# Patient Record
Sex: Male | Born: 1965 | Race: Asian | Hispanic: No | Marital: Married | Smoking: Never smoker
Health system: Southern US, Community
[De-identification: ages and names within clinical notes are randomized; demographics above are authoritative.]

## PROBLEM LIST (undated history)

## (undated) DIAGNOSIS — Z860100 Personal history of colon polyps, unspecified: Secondary | ICD-10-CM

## (undated) DIAGNOSIS — K219 Gastro-esophageal reflux disease without esophagitis: Secondary | ICD-10-CM

## (undated) DIAGNOSIS — E079 Disorder of thyroid, unspecified: Secondary | ICD-10-CM

## (undated) DIAGNOSIS — I10 Essential (primary) hypertension: Secondary | ICD-10-CM

## (undated) HISTORY — DX: Personal history of colonic polyps: Z86.010

## (undated) HISTORY — DX: Personal history of colon polyps, unspecified: Z86.0100

## (undated) HISTORY — PX: UPPER GASTROINTESTINAL ENDOSCOPY: SHX188

## (undated) HISTORY — PX: APPENDECTOMY: SHX54

---

## 2010-03-05 ENCOUNTER — Emergency Department (HOSPITAL_BASED_OUTPATIENT_CLINIC_OR_DEPARTMENT_OTHER): Admission: EM | Admit: 2010-03-05 | Discharge: 2010-03-05 | Payer: Self-pay | Admitting: Emergency Medicine

## 2010-03-05 ENCOUNTER — Ambulatory Visit: Payer: Self-pay | Admitting: Diagnostic Radiology

## 2010-11-19 LAB — BASIC METABOLIC PANEL WITH GFR
BUN: 24 mg/dL — ABNORMAL HIGH (ref 6–23)
Potassium: 4.2 meq/L (ref 3.5–5.1)
Sodium: 142 meq/L (ref 135–145)

## 2010-11-19 LAB — POCT CARDIAC MARKERS
CKMB, poc: <1 ng/mL — ABNORMAL LOW (ref 1.0–8.0)
Myoglobin, poc: 48.2 ng/mL (ref 12–200)
Troponin i, poc: <0.05 ng/mL (ref 0.00–0.09)

## 2010-11-19 LAB — CBC
HCT: 46.2 % (ref 39.0–52.0)
Hemoglobin: 15.9 g/dL (ref 13.0–17.0)
MCH: 30.4 pg (ref 26.0–34.0)
MCHC: 34.3 g/dL (ref 30.0–36.0)
MCV: 88.5 fL (ref 78.0–100.0)
Platelets: 226 10*3/uL (ref 150–400)
RBC: 5.22 MIL/uL (ref 4.22–5.81)
RDW: 12.1 % (ref 11.5–15.5)
WBC: 6.2 K/uL (ref 4.0–10.5)

## 2010-11-19 LAB — DIFFERENTIAL
Basophils Absolute: 0.1 10*3/uL (ref 0.0–0.1)
Basophils Relative: 2 % — ABNORMAL HIGH (ref 0–1)
Eosinophils Absolute: 0.2 10*3/uL (ref 0.0–0.7)
Eosinophils Relative: 3 % (ref 0–5)
Lymphocytes Relative: 27 % (ref 12–46)
Lymphs Abs: 1.7 10*3/uL (ref 0.7–4.0)
Monocytes Absolute: 0.5 K/uL (ref 0.1–1.0)
Monocytes Relative: 8 % (ref 3–12)
Neutro Abs: 3.7 K/uL (ref 1.7–7.7)
Neutrophils Relative %: 60 % (ref 43–77)

## 2010-11-19 LAB — BASIC METABOLIC PANEL
CO2: 27 mEq/L (ref 19–32)
Calcium: 9 mg/dL (ref 8.4–10.5)
Chloride: 104 mEq/L (ref 96–112)
Creatinine, Ser: 1.1 mg/dL (ref 0.4–1.5)
GFR calc Af Amer: >60 mL/min (ref >60)
GFR calc non Af Amer: >60 mL/min (ref >60)
Glucose, Bld: 105 mg/dL — ABNORMAL HIGH (ref 70–99)

## 2010-11-19 LAB — APTT: aPTT: 31 s (ref 24–37)

## 2010-11-19 LAB — PROTIME-INR
INR: 1.06 (ref 0.00–1.49)
Prothrombin Time: 13.7 s (ref 11.6–15.2)

## 2010-11-19 LAB — D-DIMER, QUANTITATIVE: D-Dimer, Quant: <0.22 ug{FEU}/mL (ref 0.00–0.48)

## 2017-06-09 ENCOUNTER — Encounter (HOSPITAL_COMMUNITY): Payer: Self-pay | Admitting: *Deleted

## 2017-06-09 ENCOUNTER — Emergency Department (HOSPITAL_BASED_OUTPATIENT_CLINIC_OR_DEPARTMENT_OTHER)
Admission: EM | Admit: 2017-06-09 | Discharge: 2017-06-09 | Disposition: A | Discharge status: Home / Self Care | Payer: BLUE CROSS/BLUE SHIELD | Source: Home / Self Care | Attending: Emergency Medicine | Admitting: Emergency Medicine

## 2017-06-09 ENCOUNTER — Encounter (HOSPITAL_BASED_OUTPATIENT_CLINIC_OR_DEPARTMENT_OTHER): Payer: Self-pay

## 2017-06-09 ENCOUNTER — Observation Stay (HOSPITAL_COMMUNITY)
Admission: EM | Admit: 2017-06-09 | Discharge: 2017-06-13 | DRG: 916 | Disposition: A | Discharge status: Home / Self Care | Payer: BLUE CROSS/BLUE SHIELD | Attending: Internal Medicine | Admitting: Internal Medicine

## 2017-06-09 DIAGNOSIS — T7840XA Allergy, unspecified, initial encounter: Secondary | ICD-10-CM

## 2017-06-09 DIAGNOSIS — T380X5A Adverse effect of glucocorticoids and synthetic analogues, initial encounter: Secondary | ICD-10-CM | POA: Diagnosis not present

## 2017-06-09 DIAGNOSIS — E079 Disorder of thyroid, unspecified: Secondary | ICD-10-CM

## 2017-06-09 DIAGNOSIS — L509 Urticaria, unspecified: Secondary | ICD-10-CM | POA: Insufficient documentation

## 2017-06-09 DIAGNOSIS — R739 Hyperglycemia, unspecified: Secondary | ICD-10-CM | POA: Diagnosis not present

## 2017-06-09 DIAGNOSIS — Z79899 Other long term (current) drug therapy: Secondary | ICD-10-CM | POA: Diagnosis not present

## 2017-06-09 DIAGNOSIS — D72829 Elevated white blood cell count, unspecified: Secondary | ICD-10-CM | POA: Diagnosis present

## 2017-06-09 DIAGNOSIS — Z2809 Immunization not carried out because of other contraindication: Secondary | ICD-10-CM

## 2017-06-09 DIAGNOSIS — T783XXA Angioneurotic edema, initial encounter: Principal | ICD-10-CM | POA: Diagnosis present

## 2017-06-09 DIAGNOSIS — E039 Hypothyroidism, unspecified: Secondary | ICD-10-CM | POA: Diagnosis not present

## 2017-06-09 DIAGNOSIS — K219 Gastro-esophageal reflux disease without esophagitis: Secondary | ICD-10-CM | POA: Diagnosis present

## 2017-06-09 DIAGNOSIS — I10 Essential (primary) hypertension: Secondary | ICD-10-CM | POA: Diagnosis present

## 2017-06-09 DIAGNOSIS — R091 Pleurisy: Secondary | ICD-10-CM

## 2017-06-09 DIAGNOSIS — X58XXXA Exposure to other specified factors, initial encounter: Secondary | ICD-10-CM | POA: Diagnosis present

## 2017-06-09 HISTORY — DX: Essential (primary) hypertension: I10

## 2017-06-09 HISTORY — DX: Gastro-esophageal reflux disease without esophagitis: K21.9

## 2017-06-09 HISTORY — DX: Disorder of thyroid, unspecified: E07.9

## 2017-06-09 LAB — BASIC METABOLIC PANEL
Anion gap: 9 (ref 5–15)
BUN: 19 mg/dL (ref 6–20)
CALCIUM: 8.4 mg/dL — AB (ref 8.9–10.3)
CHLORIDE: 106 mmol/L (ref 101–111)
CO2: 22 mmol/L (ref 22–32)
CREATININE: 1.16 mg/dL (ref 0.61–1.24)
GFR calc Af Amer: >60 mL/min (ref >60)
GFR calc non Af Amer: >60 mL/min (ref >60)
GLUCOSE: 150 mg/dL — AB (ref 65–99)
Potassium: 3.9 mmol/L (ref 3.5–5.1)
Sodium: 137 mmol/L (ref 135–145)

## 2017-06-09 LAB — CBC
HEMATOCRIT: 42.5 % (ref 39.0–52.0)
HEMOGLOBIN: 14.8 g/dL (ref 13.0–17.0)
MCH: 30 pg (ref 26.0–34.0)
MCHC: 34.8 g/dL (ref 30.0–36.0)
MCV: 86 fL (ref 78.0–100.0)
Platelets: 236 10*3/uL (ref 150–400)
RBC: 4.94 MIL/uL (ref 4.22–5.81)
RDW: 12.8 % (ref 11.5–15.5)
WBC: 17.8 10*3/uL — ABNORMAL HIGH (ref 4.0–10.5)

## 2017-06-09 MED ORDER — FAMOTIDINE 20 MG PO TABS
20.0000 mg | ORAL_TABLET | Freq: Once | ORAL | Status: AC
  Administered 2017-06-09: 20 mg via ORAL
  Filled 2017-06-09: qty 1

## 2017-06-09 MED ORDER — SODIUM CHLORIDE 0.9 % IV BOLUS (SEPSIS)
1000.0000 mL | Freq: Once | INTRAVENOUS | Status: AC
  Administered 2017-06-09: 1000 mL via INTRAVENOUS

## 2017-06-09 MED ORDER — FAMOTIDINE 20 MG PO TABS: 20.0000 mg | ORAL_TABLET | Freq: Two times a day (BID) | ORAL | 0 refills | Status: DC

## 2017-06-09 MED ORDER — DIPHENHYDRAMINE HCL 50 MG/ML IJ SOLN
50.0000 mg | Freq: Once | INTRAMUSCULAR | Status: AC
  Administered 2017-06-09: 50 mg via INTRAMUSCULAR
  Filled 2017-06-09: qty 1

## 2017-06-09 MED ORDER — DIPHENHYDRAMINE HCL 50 MG/ML IJ SOLN
25.0000 mg | Freq: Once | INTRAMUSCULAR | Status: AC
  Administered 2017-06-09: 25 mg via INTRAVENOUS
  Filled 2017-06-09: qty 1

## 2017-06-09 MED ORDER — EPINEPHRINE 0.3 MG/0.3ML IJ SOAJ
0.3000 mg | Freq: Once | INTRAMUSCULAR | Status: AC
  Administered 2017-06-09: 0.3 mg via INTRAMUSCULAR
  Filled 2017-06-09: qty 0.3

## 2017-06-09 MED ORDER — DEXAMETHASONE SODIUM PHOSPHATE 10 MG/ML IJ SOLN
10.0000 mg | Freq: Once | INTRAMUSCULAR | Status: AC
  Administered 2017-06-09: 10 mg via INTRAVENOUS
  Filled 2017-06-09: qty 1

## 2017-06-09 MED ORDER — DIPHENHYDRAMINE HCL 25 MG PO TABS: 50.0000 mg | ORAL_TABLET | Freq: Four times a day (QID) | ORAL | 0 refills | Status: DC | PRN

## 2017-06-09 MED ORDER — LORAZEPAM 1 MG PO TABS: 1.0000 mg | ORAL_TABLET | Freq: Three times a day (TID) | ORAL | 0 refills | Status: DC | PRN

## 2017-06-09 MED ORDER — FAMOTIDINE IN NACL 20-0.9 MG/50ML-% IV SOLN
20.0000 mg | Freq: Once | INTRAVENOUS | Status: AC
  Administered 2017-06-09: 20 mg via INTRAVENOUS
  Filled 2017-06-09: qty 50

## 2017-06-09 MED ORDER — SODIUM CHLORIDE 0.9 % IV SOLN
INTRAVENOUS | Status: DC
  Administered 2017-06-09 – 2017-06-10 (×2): via INTRAVENOUS

## 2017-06-09 MED ORDER — DIPHENHYDRAMINE HCL 25 MG PO TABS: 50.0000 mg | ORAL_TABLET | ORAL | 0 refills | Status: DC | PRN

## 2017-06-09 NOTE — ED Provider Notes (Signed)
[facility]-EMERGENCY DEPT Provider Note   CSN: [id number] Arrival date & time: 06/09/17  2021     History   Chief Complaint Chief Complaint  Patient presents with  . Allergic Reaction    HPI Christian Hays is a 51 y.o. male.  HPI 51 year old man presents today complaining of urticaria. He states he began having some allergic symptoms yesterday after eating at [employer]. This consisted mainly of a rash. He was seen in his primary care office and started on Benadryl. The rash worsened during the night and he was seen at [facility] [place] this morning. That time he received drill, Pepcid, and is started on prednisone. This afternoon the symptoms became worse and he has had some coughing, dyspnea, sensation of his tongue swelling. The rash has also worsened. He has had no recent changes in medications and has had no previous known allergens. Past Medical History:  Diagnosis Date  . GERD (gastroesophageal reflux disease)   . Hypertension   . Thyroid disease     There are no active problems to display for this patient.   Past Surgical History:  Procedure Laterality Date  . APPENDECTOMY         Home Medications    Prior to Admission medications   Medication Sig Start Date End Date Taking? Authorizing Provider  diphenhydrAMINE (BENADRYL) 25 MG tablet Take 2 tablets (50 mg total) by mouth every 6 (six) hours as needed for itching. 06/09/17   Arby Barrette, MD  famotidine (PEPCID) 20 MG tablet Take 1 tablet (20 mg total) by mouth 2 (two) times daily. 06/09/17   Arby Barrette, MD  LORazepam (ATIVAN) 1 MG tablet Take 1 tablet (1 mg total) by mouth 3 (three) times daily as needed for sleep. 06/09/17   Arby Barrette, MD  OMEPRAZOLE PO Take by mouth.    [provider]  PREDNISONE PO Take by mouth.    [provider]  UNABLE TO FIND Med Name: HTN, Thyroid meds    [provider]    Family History No family history on file.  Social History Social  History  Substance Use Topics  . Smoking status: Never Smoker  . Smokeless tobacco: Never Used  . Alcohol use No     Allergies   Patient has no known allergies.   Review of Systems Review of Systems  All other systems reviewed and are negative.    Physical Exam Updated Vital Signs BP (!) 126/95 (BP Location: Left Arm)   Pulse 79   Temp (!) 97.4 F (36.3 C) (Oral)   Resp 18   Wt 70.8 kg (156 lb)   SpO2 97%   BMI 21.16 kg/m   Physical Exam  Constitutional: He is oriented to person, place, and time. He appears well-developed and well-nourished.  HENT:  Head: Normocephalic.  Right Ear: External ear normal.  Left Ear: External ear normal.  Eyes: Pupils are equal, round, and reactive to light. Conjunctivae and EOM are normal.  Neck: Normal range of motion. Neck supple.  Cardiovascular: Normal rate, regular rhythm and normal heart sounds.   Pulmonary/Chest: Effort normal and breath sounds normal.  Abdominal: Soft. Bowel sounds are normal.  Musculoskeletal: Normal range of motion.  Neurological: He is alert and oriented to person, place, and time.  Skin: Capillary refill takes less than 2 seconds. Rash noted.  Urticarial rash involving right eye, head, neck, trunk, and all 4 extremities.  Psychiatric: He has a normal mood and affect. His behavior is normal.  Nursing  note and vitals reviewed.    ED Treatments / Results  Labs (all labs ordered are listed, but only abnormal results are displayed) Labs Reviewed  CBC  BASIC METABOLIC PANEL    EKG  EKG Interpretation None       Radiology No results found.  Procedures Procedures (including critical care time)  Medications Ordered in ED Medications  sodium chloride 0.9 % bolus 1,000 mL (0 mLs Intravenous Stopped 06/09/17 2152)    And  0.9 %  sodium chloride infusion ( Intravenous New Bag/Given 06/09/17 2152)  diphenhydrAMINE (BENADRYL) injection 25 mg (25 mg Intravenous Given 06/09/17 2048)  EPINEPHrine  (EPI-PEN) injection 0.3 mg (0.3 mg Intramuscular Given 06/09/17 2054)  dexamethasone (DECADRON) injection 10 mg (10 mg Intravenous Given 06/09/17 2047)  famotidine (PEPCID) IVPB 20 mg premix (0 mg Intravenous Stopped 06/09/17 2152)     Initial Impression / Assessment and Plan / ED Course  I have reviewed the triage vital signs and the nursing notes.  Pertinent labs & imaging results that were available during my care of the patient were reviewed by me and considered in my medical decision making (see chart for details).     11:14 PM Vitals:   06/09/17 2028  BP: (!) 126/95  Pulse: 79  Resp: 18  Temp: (!) 97.4 F (36.3 C)  SpO2: 97%   Patient Hemodynamically stable but continues to have diffuse hives. Plan observation. Discussed with Dr. Melynda Ripple and he will see and assess Final Clinical Impressions(s) / ED Diagnoses   Final diagnoses:  Allergic reaction, initial encounter    New Prescriptions New Prescriptions   No medications on file     Margarita Grizzle, MD 06/09/17 2353

## 2017-06-09 NOTE — ED Notes (Signed)
ED Provider at bedside. 

## 2017-06-09 NOTE — Discharge Instructions (Signed)
1. Take 60 mg of prednisone today and tomorrow, for the next 3 days take 40 mg, for the next 3 days take 20 mg, for the last 3 days take 10 mg. 2. Take Benadryl 50 mg every 4-6 hours for itching as needed. 3. Take Pepcid 20 mg twice daily. 4. Try to eliminate all foods and cosmetic products that have multiple ingredients. Try to eat only very simple, home prepared foods that you are sure you are not allergic to. Get nonallergenic soaps and cosmetic products free of fragrance and dye.

## 2017-06-09 NOTE — ED Triage Notes (Signed)
Allergic reaction since Saturday am while eating at [employer].  huives itching  He was treated at an ed and has been back to [place] [facility] x 2 for the same.  Today he has hives some difficulty breathing  And he feels like his tongue was swollen.  Hives over his entire body  Alert  No distress

## 2017-06-09 NOTE — ED Triage Notes (Addendum)
Pt reports generalized body rash that itches - onset yesterday - seen by PCP for possible food allergy (new onset), given steroid shot and placed on Prednisone. Denies fever, denies history of same rash, denies staying at hotels, or contacts with similar rash. Denies known allergies. States last international travel was 2 months ago. Denies insect bite.

## 2017-06-09 NOTE — ED Provider Notes (Signed)
[facility] Provider Note   CSN: [id number] Arrival date & time: 06/09/17  4098     History   Chief Complaint Chief Complaint  Patient presents with  . Rash    HPI Christian Hays is a 51 y.o. male.  HPI Patient developed a very itchy swollen rash day before yesterday. He had gotten coffee and a egg biscuit at [employer]. He reports he always has a cough and has no issues. He reports about an hour or 2 after that biscuit he started developing these patches of swollen itchy rash. No associated difficulty breathing or other associated symptoms. Patient saw his PCP and was given prednisone and Benadryl. Patient reports she's had terrible itching overnight and his hands are more swollen. Patient got a shot of steroid in the office and has taken 40 mg of prednisone. He reports he is taking 25 mg and last dose was yesterday evening. Past Medical History:  Diagnosis Date  . GERD (gastroesophageal reflux disease)   . Hypertension   . Thyroid disease     There are no active problems to display for this patient.   Past Surgical History:  Procedure Laterality Date  . APPENDECTOMY         Home Medications    Prior to Admission medications   Medication Sig Start Date End Date Taking? Authorizing Provider  OMEPRAZOLE PO Take by mouth.   Yes [provider]  PREDNISONE PO Take by mouth.   Yes [provider]  UNABLE TO FIND Med Name: HTN, Thyroid meds   Yes [provider]  diphenhydrAMINE (BENADRYL) 25 MG tablet Take 2 tablets (50 mg total) by mouth every 6 (six) hours as needed for itching. 06/09/17   Arby Barrette, MD  famotidine (PEPCID) 20 MG tablet Take 1 tablet (20 mg total) by mouth 2 (two) times daily. 06/09/17   Arby Barrette, MD  LORazepam (ATIVAN) 1 MG tablet Take 1 tablet (1 mg total) by mouth 3 (three) times daily as needed for sleep. 06/09/17   Arby Barrette, MD    Family History History reviewed. No pertinent family  history.  Social History Social History  Substance Use Topics  . Smoking status: Never Smoker  . Smokeless tobacco: Never Used  . Alcohol use No     Allergies   Patient has no known allergies.   Review of Systems Review of Systems 10 Systems reviewed and are negative for acute change except as noted in the HPI.   Physical Exam Updated Vital Signs BP 113/79 (BP Location: Right Arm)   Pulse 68   Temp 97.6 F (36.4 C) (Oral)   Resp 16   Ht 6' (1.829 m)   Wt 68.9 kg (152 lb)   SpO2 98%   BMI 20.61 kg/m   Physical Exam  Constitutional: He is oriented to person, place, and time. He appears well-developed and well-nourished.  HENT:  Head: Normocephalic and atraumatic.  Nose: Nose normal.  Mouth/Throat: Oropharynx is clear and moist.  Eyes: Conjunctivae and EOM are normal.  Neck: Neck supple.  Cardiovascular: Normal rate and regular rhythm.   No murmur heard. Pulmonary/Chest: Effort normal and breath sounds normal. No respiratory distress.  Abdominal: Soft. There is no tenderness.  Musculoskeletal: He exhibits no edema.  Neurological: He is alert and oriented to person, place, and time. No cranial nerve deficit. He exhibits normal muscle tone. Coordination normal.  Skin: Skin is warm and dry. Rash noted.  Psychiatric: He has a normal mood and affect.  Nursing note and vitals reviewed.          ED Treatments / Results  Labs (all labs ordered are listed, but only abnormal results are displayed) Labs Reviewed - No data to display  EKG  EKG Interpretation None       Radiology No results found.  Procedures Procedures (including critical care time)  Medications Ordered in ED Medications  diphenhydrAMINE (BENADRYL) injection 50 mg (50 mg Intramuscular Given 06/09/17 0752)  famotidine (PEPCID) tablet 20 mg (20 mg Oral Given 06/09/17 5409)     Initial Impression / Assessment and Plan / ED Course  I have reviewed the triage vital signs and the nursing  notes.  Pertinent labs & imaging results that were available during my care of the patient were reviewed by me and considered in my medical decision making (see chart for details).      Final Clinical Impressions(s) / ED Diagnoses   Final diagnoses:  Urticaria   Patient is very symptomatic and uncomfortable with urticarial rash. This is likely allergic however unknown trigger. Possibly something contained within an egg biscuit from [employer]. His physician is artery planning for allergy testing. At this time we'll have the patient increase his prednisone to 60 mg for 2 days and then begin his taper. Will add Pepcid twice daily. Patient has been taking Benadryl 25 mg will have him take 50 every 4-6 hours. Patient is extremely uncomfortable from itching, will add Ativan when necessary if other medications are still not allowing him to sleep. He is otherwise nontoxic and well and appearance. New Prescriptions New Prescriptions   DIPHENHYDRAMINE (BENADRYL) 25 MG TABLET    Take 2 tablets (50 mg total) by mouth every 6 (six) hours as needed for itching.   FAMOTIDINE (PEPCID) 20 MG TABLET    Take 1 tablet (20 mg total) by mouth 2 (two) times daily.   LORAZEPAM (ATIVAN) 1 MG TABLET    Take 1 tablet (1 mg total) by mouth 3 (three) times daily as needed for sleep.     Arby Barrette, MD 06/09/17 0800

## 2017-06-10 ENCOUNTER — Observation Stay (HOSPITAL_COMMUNITY): Payer: BLUE CROSS/BLUE SHIELD

## 2017-06-10 DIAGNOSIS — L509 Urticaria, unspecified: Secondary | ICD-10-CM | POA: Diagnosis not present

## 2017-06-10 DIAGNOSIS — D72829 Elevated white blood cell count, unspecified: Secondary | ICD-10-CM | POA: Diagnosis not present

## 2017-06-10 DIAGNOSIS — T783XXA Angioneurotic edema, initial encounter: Secondary | ICD-10-CM | POA: Diagnosis not present

## 2017-06-10 DIAGNOSIS — I10 Essential (primary) hypertension: Secondary | ICD-10-CM | POA: Diagnosis not present

## 2017-06-10 DIAGNOSIS — K219 Gastro-esophageal reflux disease without esophagitis: Secondary | ICD-10-CM

## 2017-06-10 DIAGNOSIS — Z2809 Immunization not carried out because of other contraindication: Secondary | ICD-10-CM | POA: Diagnosis not present

## 2017-06-10 LAB — CBC WITH DIFFERENTIAL/PLATELET
BASOS PCT: 0 %
Basophils Absolute: 0 10*3/uL (ref 0.0–0.1)
EOS ABS: 0 10*3/uL (ref 0.0–0.7)
Eosinophils Relative: 0 %
HEMATOCRIT: 41.8 % (ref 39.0–52.0)
Hemoglobin: 14.8 g/dL (ref 13.0–17.0)
Lymphocytes Relative: 4 %
Lymphs Abs: 0.5 10*3/uL — ABNORMAL LOW (ref 0.7–4.0)
MCH: 30.7 pg (ref 26.0–34.0)
MCHC: 35.4 g/dL (ref 30.0–36.0)
MCV: 86.7 fL (ref 78.0–100.0)
MONO ABS: 0.6 10*3/uL (ref 0.1–1.0)
MONOS PCT: 4 %
NEUTROS ABS: 13 10*3/uL — AB (ref 1.7–7.7)
Neutrophils Relative %: 92 %
Platelets: 232 10*3/uL (ref 150–400)
RBC: 4.82 MIL/uL (ref 4.22–5.81)
RDW: 12.6 % (ref 11.5–15.5)
WBC: 14.1 10*3/uL — ABNORMAL HIGH (ref 4.0–10.5)

## 2017-06-10 LAB — URINALYSIS, ROUTINE W REFLEX MICROSCOPIC
BILIRUBIN URINE: NEGATIVE
Glucose, UA: NEGATIVE mg/dL
Hgb urine dipstick: NEGATIVE
KETONES UR: NEGATIVE mg/dL
Leukocytes, UA: NEGATIVE
NITRITE: NEGATIVE
PROTEIN: NEGATIVE mg/dL
Specific Gravity, Urine: 1.005 (ref 1.005–1.030)
pH: 6 (ref 5.0–8.0)

## 2017-06-10 LAB — BASIC METABOLIC PANEL
Anion gap: 7 (ref 5–15)
BUN: 19 mg/dL (ref 6–20)
CALCIUM: 8.5 mg/dL — AB (ref 8.9–10.3)
CHLORIDE: 106 mmol/L (ref 101–111)
CO2: 24 mmol/L (ref 22–32)
CREATININE: 0.89 mg/dL (ref 0.61–1.24)
Glucose, Bld: 174 mg/dL — ABNORMAL HIGH (ref 65–99)
Potassium: 4.8 mmol/L (ref 3.5–5.1)
SODIUM: 137 mmol/L (ref 135–145)

## 2017-06-10 LAB — D-DIMER, QUANTITATIVE (NOT AT ARMC): D DIMER QUANT: 3.48 ug{FEU}/mL — AB (ref 0.00–0.50)

## 2017-06-10 LAB — HEPATIC FUNCTION PANEL
ALBUMIN: 3.8 g/dL (ref 3.5–5.0)
ALT: 55 U/L (ref 17–63)
AST: 62 U/L — ABNORMAL HIGH (ref 15–41)
Alkaline Phosphatase: 66 U/L (ref 38–126)
BILIRUBIN DIRECT: 0.2 mg/dL (ref 0.1–0.5)
BILIRUBIN INDIRECT: 0.5 mg/dL (ref 0.3–0.9)
BILIRUBIN TOTAL: 0.7 mg/dL (ref 0.3–1.2)
Total Protein: 6.3 g/dL — ABNORMAL LOW (ref 6.5–8.1)

## 2017-06-10 LAB — HIV ANTIBODY (ROUTINE TESTING W REFLEX): HIV Screen 4th Generation wRfx: NONREACTIVE

## 2017-06-10 LAB — CBC
HEMATOCRIT: 43.1 % (ref 39.0–52.0)
HEMOGLOBIN: 15.1 g/dL (ref 13.0–17.0)
MCH: 30.1 pg (ref 26.0–34.0)
MCHC: 35 g/dL (ref 30.0–36.0)
MCV: 86 fL (ref 78.0–100.0)
Platelets: 241 10*3/uL (ref 150–400)
RBC: 5.01 MIL/uL (ref 4.22–5.81)
RDW: 13 % (ref 11.5–15.5)
WBC: 16.3 10*3/uL — ABNORMAL HIGH (ref 4.0–10.5)

## 2017-06-10 LAB — T4, FREE: Free T4: 0.71 ng/dL (ref 0.61–1.12)

## 2017-06-10 LAB — CREATININE, SERUM
Creatinine, Ser: 1.1 mg/dL (ref 0.61–1.24)
GFR calc Af Amer: >60 mL/min (ref >60)
GFR calc non Af Amer: >60 mL/min (ref >60)

## 2017-06-10 LAB — GLUCOSE, CAPILLARY
GLUCOSE-CAPILLARY: 168 mg/dL — AB (ref 65–99)
Glucose-Capillary: 128 mg/dL — ABNORMAL HIGH (ref 65–99)

## 2017-06-10 LAB — TSH: TSH: 0.829 u[IU]/mL (ref 0.350–4.500)

## 2017-06-10 LAB — SEDIMENTATION RATE: SED RATE: 2 mm/h (ref 0–16)

## 2017-06-10 MED ORDER — ONDANSETRON HCL 4 MG/2ML IJ SOLN: 4.0000 mg | Freq: Four times a day (QID) | INTRAMUSCULAR | Status: DC | PRN

## 2017-06-10 MED ORDER — DEXAMETHASONE SODIUM PHOSPHATE 10 MG/ML IJ SOLN
10.0000 mg | Freq: Once | INTRAMUSCULAR | Status: AC
  Administered 2017-06-10: 10 mg via INTRAVENOUS
  Filled 2017-06-10: qty 1

## 2017-06-10 MED ORDER — LORATADINE 10 MG PO TABS
10.0000 mg | ORAL_TABLET | Freq: Every day | ORAL | Status: DC
  Administered 2017-06-10 – 2017-06-13 (×4): 10 mg via ORAL
  Filled 2017-06-10 (×4): qty 1

## 2017-06-10 MED ORDER — HEPARIN SODIUM (PORCINE) 5000 UNIT/ML IJ SOLN
5000.0000 [IU] | Freq: Three times a day (TID) | INTRAMUSCULAR | Status: DC
  Administered 2017-06-10 (×3): 5000 [IU] via SUBCUTANEOUS
  Filled 2017-06-10 (×3): qty 1

## 2017-06-10 MED ORDER — ACETAMINOPHEN 325 MG PO TABS
650.0000 mg | ORAL_TABLET | Freq: Four times a day (QID) | ORAL | Status: DC | PRN
  Administered 2017-06-10 – 2017-06-12 (×2): 650 mg via ORAL
  Filled 2017-06-10: qty 2

## 2017-06-10 MED ORDER — DIPHENHYDRAMINE-ZINC ACETATE 2-0.1 % EX CREA
TOPICAL_CREAM | Freq: Three times a day (TID) | CUTANEOUS | Status: DC | PRN
  Administered 2017-06-10 – 2017-06-12 (×2): 1 via TOPICAL
  Filled 2017-06-10 (×3): qty 28

## 2017-06-10 MED ORDER — ACETAMINOPHEN 650 MG RE SUPP: 650.0000 mg | Freq: Four times a day (QID) | RECTAL | Status: DC | PRN

## 2017-06-10 MED ORDER — ONDANSETRON HCL 4 MG PO TABS: 4.0000 mg | ORAL_TABLET | Freq: Four times a day (QID) | ORAL | Status: DC | PRN

## 2017-06-10 MED ORDER — PREDNISONE 50 MG PO TABS
50.0000 mg | ORAL_TABLET | Freq: Every day | ORAL | Status: DC
  Administered 2017-06-10: 50 mg via ORAL
  Filled 2017-06-10: qty 1

## 2017-06-10 MED ORDER — EPINEPHRINE PF 1 MG/10ML IJ SOSY
0.3000 mg | PREFILLED_SYRINGE | Freq: Every day | INTRAMUSCULAR | Status: DC | PRN
  Filled 2017-06-10: qty 10

## 2017-06-10 MED ORDER — INFLUENZA VAC SPLIT QUAD 0.5 ML IM SUSY
0.5000 mL | PREFILLED_SYRINGE | INTRAMUSCULAR | Status: DC
  Filled 2017-06-10: qty 0.5

## 2017-06-10 MED ORDER — INSULIN ASPART 100 UNIT/ML ~~LOC~~ SOLN: 0.0000 [IU] | Freq: Every day | SUBCUTANEOUS | Status: DC

## 2017-06-10 MED ORDER — INSULIN ASPART 100 UNIT/ML ~~LOC~~ SOLN
0.0000 [IU] | Freq: Three times a day (TID) | SUBCUTANEOUS | Status: DC
Start: 2017-06-10 — End: 2017-06-13
  Administered 2017-06-10 – 2017-06-12 (×3): 1 [IU] via SUBCUTANEOUS
  Administered 2017-06-12: 2 [IU] via SUBCUTANEOUS
  Administered 2017-06-13: 1 [IU] via SUBCUTANEOUS

## 2017-06-10 MED ORDER — DIPHENHYDRAMINE HCL 25 MG PO CAPS
50.0000 mg | ORAL_CAPSULE | Freq: Four times a day (QID) | ORAL | Status: DC
  Administered 2017-06-10 – 2017-06-12 (×10): 50 mg via ORAL
  Filled 2017-06-10 (×10): qty 2

## 2017-06-10 MED ORDER — HYDROXYZINE HCL 25 MG PO TABS
50.0000 mg | ORAL_TABLET | Freq: Three times a day (TID) | ORAL | Status: DC
  Administered 2017-06-10 – 2017-06-13 (×10): 50 mg via ORAL
  Filled 2017-06-10 (×10): qty 2

## 2017-06-10 MED ORDER — IOPAMIDOL (ISOVUE-370) INJECTION 76%
INTRAVENOUS | Status: AC
  Administered 2017-06-10: 80 mL
  Filled 2017-06-10: qty 100

## 2017-06-10 MED ORDER — HYDROXYZINE HCL 25 MG PO TABS
25.0000 mg | ORAL_TABLET | Freq: Once | ORAL | Status: AC
  Administered 2017-06-10: 25 mg via ORAL
  Filled 2017-06-10: qty 1

## 2017-06-10 MED ORDER — LEVOTHYROXINE SODIUM 50 MCG PO TABS
50.0000 ug | ORAL_TABLET | Freq: Every day | ORAL | Status: DC
  Administered 2017-06-10 – 2017-06-13 (×4): 50 ug via ORAL
  Filled 2017-06-10 (×4): qty 1

## 2017-06-10 MED ORDER — FAMOTIDINE 20 MG PO TABS
20.0000 mg | ORAL_TABLET | Freq: Two times a day (BID) | ORAL | Status: DC
  Administered 2017-06-11 – 2017-06-12 (×3): 20 mg via ORAL
  Filled 2017-06-10 (×3): qty 1

## 2017-06-10 NOTE — Progress Notes (Addendum)
Patient admitted after midnight, please see H&P.  Will change meds to schedule.  No airway compromise but significant itching.  If symptoms continue, will need to be changed back to IV. Will need close follow up with allergy/immunology to find trigger.      Marlin Canary DO

## 2017-06-10 NOTE — H&P (Addendum)
[facility] History and Physical  Christian Hays SFS:[id number] DOB: [birth year] DOA: 06/09/2017  Referring physician:  PCP: Glendon Axe, MD   Chief Complaint: allergic reaction  HPI: Christian Hays is a 51 y.o. male  with past medical history of hypothyroidism of unknown cause, hypertension and reflux presents emergency room with chief complaint of rash and swelling. Patient states he ate a a biscuit and [employer] on Saturday and then developed symptoms of rash and swelling. Symptoms worsened. Patient occasionally feels short of breath. No previous history of this type of reaction. No family history of the reaction. Denies any new medications or exposures. No history of tick bites. No recent bug bites. No family history of similar reaction.  ED course: Allergy medications given for anaphylaxis including epinephrine. Patient felt somewhat better. Hospitalists asked to admit patient due to 3 consecutive emergency room visits in the last day.  Primary language: Persian  Review of Systems:  As per HPI otherwise 10 point review of systems negative.    Past Medical History:  Diagnosis Date  . GERD (gastroesophageal reflux disease)   . Hypertension   . Thyroid disease    Past Surgical History:  Procedure Laterality Date  . APPENDECTOMY     Social History:  reports that he has never smoked. He has never used smokeless tobacco. He reports that he does not drink alcohol or use drugs.  No Known Allergies  No family history on file.   Prior to Admission medications   Medication Sig Start Date End Date Taking? Authorizing Provider  diphenhydrAMINE (BENADRYL) 25 MG tablet Take 2 tablets (50 mg total) by mouth every 6 (six) hours as needed for itching. 06/09/17  Yes Pfeiffer, Jeannie Done, MD  levothyroxine (SYNTHROID, LEVOTHROID) 50 MCG tablet Take 50 mcg by mouth daily. 03/10/16  Yes [provider]  LORazepam (ATIVAN) 1 MG tablet Take 1 tablet (1 mg total) by mouth 3 (three) times  daily as needed for sleep. 06/09/17  Yes Charlesetta Shanks, MD  omeprazole (PRILOSEC) 40 MG capsule Take 40 mg by mouth daily.    Yes [provider]  predniSONE (DELTASONE) 50 MG tablet Take 50 mg by mouth daily.    Yes [provider]  famotidine (PEPCID) 20 MG tablet Take 1 tablet (20 mg total) by mouth 2 (two) times daily. Patient not taking: Reported on 06/09/2017 06/09/17   Charlesetta Shanks, MD   Physical Exam: Vitals:   06/09/17 2130 06/09/17 2200 06/09/17 2230 06/09/17 2300  BP: 109/71 112/76 105/66 110/81  Pulse: 69 72 69 64  Resp: 16 15 16 16   Temp:      TempSrc:      SpO2: 97% 97% 98% 96%  Weight:        Wt Readings from Last 3 Encounters:  06/09/17 70.8 kg (156 lb)  06/09/17 68.9 kg (152 lb)    General:  Appears calm and comfortable; A&Ox3; angioedema of the right eyelid and urticaria Eyes:  PERRL, EOMI, normal lids, iris ENT:  grossly normal hearing, lips & tongue Neck:  no LAD, masses or thyromegaly; Thyroid appears disfigured Cardiovascular:  RRR, no m/r/g. No LE edema.  Respiratory:  CTA bilaterally, no w/r/r. Normal respiratory effort. Abdomen:  soft, ntnd Skin:  no rash or induration seen on limited exam Musculoskeletal:  grossly normal tone BUE/BLE Psychiatric:  grossly normal mood and affect, speech fluent and appropriate Neurologic:  CN 2-12 grossly intact, moves all extremities in coordinated fashion.          Labs on  Admission:  Basic Metabolic Panel:  Recent Labs Lab 06/09/17 2158  NA 137  K 3.9  CL 106  CO2 22  GLUCOSE 150*  BUN 19  CREATININE 1.16  CALCIUM 8.4*   Liver Function Tests: No results for input(s): AST, ALT, ALKPHOS, BILITOT, PROT, ALBUMIN in the last 168 hours. No results for input(s): LIPASE, AMYLASE in the last 168 hours. No results for input(s): AMMONIA in the last 168 hours. CBC:  Recent Labs Lab 06/09/17 2158  WBC 17.8*  HGB 14.8  HCT 42.5  MCV 86.0  PLT 236   Cardiac Enzymes: No results for  input(s): CKTOTAL, CKMB, CKMBINDEX, TROPONINI in the last 168 hours.  BNP (last 3 results) No results for input(s): BNP in the last 8760 hours.  ProBNP (last 3 results) No results for input(s): PROBNP in the last 8760 hours.   Serum creatinine: 1.16 mg/dL 06/09/17 2158 Estimated creatinine clearance: 74.6 mL/min  CBG: No results for input(s): GLUCAP in the last 168 hours.  Radiological Exams on Admission: No results found.  EKG: pending  Assessment/Plan Principal Problem:   Urticaria Active Problems:   Elevated WBC count   GERD (gastroesophageal reflux disease)  Urticaria w/ angioedema Given steroids, epi, benadryl and pepcid in ED Has hx of thyroid issues and pt reports not w/u - ordered Thyroid abs, Free T4, TSH Will check: Rh Factor, O&P stool exam, ANA w/ reflex, D-dimer, ESR, UA No signs of airway compromise  Elevated wbc Pt has gotten steroids Will monitor  GERD Stop PPI as it rarely causes urticaria  Hypothyroidism Cont OP synthroid 50 mcg qd No signs of hyper or hypothyroidism   Code Status: FC  DVT Prophylaxis: heparin Family Communication: wife at bedside Disposition Plan: Likely discharge home tomorrow and I explained this to the patient. He is agreeable.  Status: obs med surg  Elwin Mocha, MD Family Medicine [facility] [email or web address] Password [email or web address]

## 2017-06-11 DIAGNOSIS — E039 Hypothyroidism, unspecified: Secondary | ICD-10-CM

## 2017-06-11 DIAGNOSIS — L509 Urticaria, unspecified: Secondary | ICD-10-CM | POA: Diagnosis not present

## 2017-06-11 DIAGNOSIS — T7840XA Allergy, unspecified, initial encounter: Secondary | ICD-10-CM

## 2017-06-11 DIAGNOSIS — E079 Disorder of thyroid, unspecified: Secondary | ICD-10-CM | POA: Diagnosis not present

## 2017-06-11 DIAGNOSIS — D72829 Elevated white blood cell count, unspecified: Secondary | ICD-10-CM | POA: Diagnosis not present

## 2017-06-11 LAB — GLUCOSE, CAPILLARY
GLUCOSE-CAPILLARY: 117 mg/dL — AB (ref 65–99)
GLUCOSE-CAPILLARY: 138 mg/dL — AB (ref 65–99)
GLUCOSE-CAPILLARY: 159 mg/dL — AB (ref 65–99)
Glucose-Capillary: 147 mg/dL — ABNORMAL HIGH (ref 65–99)

## 2017-06-11 LAB — ANA W/REFLEX IF POSITIVE: Anti Nuclear Antibody(ANA): NEGATIVE

## 2017-06-11 LAB — THYROID ANTIBODIES
Thyroglobulin Antibody: <1 IU/mL (ref 0.0–0.9)
Thyroperoxidase Ab SerPl-aCnc: <6 IU/mL (ref 0–34)

## 2017-06-11 LAB — RHEUMATOID FACTOR: Rhuematoid fact SerPl-aCnc: <10 IU/mL (ref 0.0–13.9)

## 2017-06-11 MED ORDER — EPINEPHRINE 0.3 MG/0.3ML IJ SOAJ
0.3000 mg | Freq: Once | INTRAMUSCULAR | Status: DC
  Filled 2017-06-11: qty 0.3

## 2017-06-11 MED ORDER — EPINEPHRINE 0.3 MG/0.3ML IJ SOAJ: 0.3000 mg | Freq: Once | INTRAMUSCULAR | Status: DC

## 2017-06-11 MED ORDER — METHYLPREDNISOLONE SODIUM SUCC 125 MG IJ SOLR
125.0000 mg | Freq: Four times a day (QID) | INTRAMUSCULAR | Status: DC
  Administered 2017-06-11: 125 mg via INTRAVENOUS
  Filled 2017-06-11: qty 2

## 2017-06-11 MED ORDER — METHYLPREDNISOLONE SODIUM SUCC 125 MG IJ SOLR
60.0000 mg | Freq: Four times a day (QID) | INTRAMUSCULAR | Status: DC
  Administered 2017-06-11 – 2017-06-12 (×4): 60 mg via INTRAVENOUS
  Filled 2017-06-11 (×4): qty 2

## 2017-06-11 MED ORDER — EPINEPHRINE (ANAPHYLAXIS) 30 MG/30ML IJ SOLN
0.3000 mg | Freq: Once | INTRAMUSCULAR | Status: AC
  Administered 2017-06-11: 0.3 mg via INTRAMUSCULAR
  Filled 2017-06-11: qty 0.3

## 2017-06-11 NOTE — Progress Notes (Signed)
Hand off Report given to [facility], on 4 N, Pt transferred to 4 N room 2, Pt reported not difficulty and tolerated transfer with no issues.   Pt and family member given opportunity for questions and had none.

## 2017-06-11 NOTE — Care Management Note (Signed)
Case Management Note  Patient Details  Name: Christian Hays MRN: [id number] Date of Birth: 1966/07/14  Subjective/Objective:                 Patient from home w wife, admitted for allergic reaction and angioedema.  Coverage: BCBS PCP: Caffie Damme   Action/Plan:   Expected Discharge Date:  06/11/17               Expected Discharge Plan:  Home/Self Care  In-House Referral:     Discharge planning Services  CM Consult  Post Acute Care Choice:    Choice offered to:     DME Arranged:    DME Agency:     HH Arranged:    HH Agency:     Status of Service:  In process, will continue to follow  If discussed at Long Length of Stay Meetings, dates discussed:    Additional Comments:  Lawerance Sabal, RN 06/11/2017, 3:08 PM

## 2017-06-11 NOTE — Progress Notes (Addendum)
Patient arrived to unit via wheelchair, family at bedside.  Vitals stable, patient oriented x4. Patient connected to telemetry and verified. Questions answered. No complaints of pain, minimal itching at this time. Continue to monitor patient.

## 2017-06-11 NOTE — Progress Notes (Addendum)
PROGRESS NOTE    Christian Hays  ZOX:[id number] DOB: [birth year] DOA: 06/09/2017 PCP: Caffie Damme, MD   Outpatient Specialists:     Brief Narrative:  Christian Hays is a 51 y.o. male  with past medical history of hypothyroidism of unknown cause, hypertension and reflux presents emergency room with chief complaint of rash and swelling. Patient states he ate a a biscuit and [employer] on Saturday and then developed symptoms of rash and swelling. Symptoms worsened. Patient occasionally feels short of breath. No previous history of this type of reaction. No family history of the reaction. Denies any new medications or exposures. No history of tick bites. No recent bug bites. No family history of similar reaction.  ED course: Allergy medications given for anaphylaxis including epinephrine. Patient felt somewhat better. Hospitalists asked to admit patient due to 3 consecutive emergency room visits in the last day.   Assessment & Plan:   Principal Problem:   Urticaria Active Problems:   Elevated WBC count   GERD (gastroesophageal reflux disease)   Urticaria w/ angioedema -worsening -will transfer to SDU for close monitoring -epi x 1  -increase steroid dose from PO to IV -no stridor -will need allergy testing as an outpatient -bendaryl/pepcid/claritin/steroids/epi/hydroxizine -discussed with PCCM, as no sign of airway compromise, will not get consult but if patient's worsens, notify sooner rather than later for possible intubation -d dimer elevated due to allergic rxn  Elevated wbc Suspect from steroids  GERD -pepcid  Hypothyroidism Cont OP synthroid 50 mcg qd -TSH and other labs normal  Hyperglycemia -due to steroids -carb mod diet here with SSI  DVT prophylaxis:  SCD's  Code Status: Full Code   Family Communication: At bedside  Disposition Plan:     Consultants:         Subjective: Worsening of hives on abd/legs as well as swelling on face and  lips -no stridor or trouble swallowing  Objective: Vitals:   06/11/17 1100 06/11/17 1117 06/11/17 1200 06/11/17 1220  BP: 116/78  107/73   Pulse: 70  69   Resp: 15  12   Temp:  97.6 F (36.4 C)  (!) 97.4 F (36.3 C)  TempSrc: Oral     SpO2: 96%  99%   Weight:      Height:        Intake/Output Summary (Last 24 hours) at 06/11/17 1414 Last data filed at 06/11/17 0900  Gross per 24 hour  Intake              432 ml  Output                0 ml  Net              432 ml   Filed Weights   06/09/17 2028 06/10/17 0243 06/11/17 0458  Weight: 70.8 kg (156 lb) 69.6 kg (153 lb 7 oz) 72.7 kg (160 lb 4.4 oz)    Examination:  General exam: anxious appearing Respiratory system: Clear to auscultation. Respiratory effort normal. Cardiovascular system: S1 & S2 heard, RRR. No JVD, murmurs, rubs, gallops or clicks. No pedal edema. Gastrointestinal system: Abdomen is nondistended, soft and nontender. No organomegaly or masses felt. Normal bowel sounds heard. Central nervous system: Alert and oriented. No focal neurological deficits. Extremities: Symmetric 5 x 5 power. Skin: raised red areas on stomach and b/l legs, eye swelling as well as lips Psychiatry: Judgement and insight appear normal. Mood & affect appropriate.     Data Reviewed: I have personally  reviewed following labs and imaging studies  CBC:  Recent Labs Lab 06/09/17 2158 06/10/17 0107 06/10/17 0431  WBC 17.8* 16.3* 14.1*  NEUTROABS  --   --  13.0*  HGB 14.8 15.1 14.8  HCT 42.5 43.1 41.8  MCV 86.0 86.0 86.7  PLT 236 241 232   Basic Metabolic Panel:  Recent Labs Lab 06/09/17 2158 06/10/17 0107 06/10/17 0431  NA 137  --  137  K 3.9  --  4.8  CL 106  --  106  CO2 22  --  24  GLUCOSE 150*  --  174*  BUN 19  --  19  CREATININE 1.16 1.10 0.89  CALCIUM 8.4*  --  8.5*   GFR: Estimated Creatinine Clearance: 99.8 mL/min (by C-G formula based on SCr of 0.89 mg/dL). Liver Function Tests:  Recent Labs Lab  06/10/17 0107  AST 62*  ALT 55  ALKPHOS 66  BILITOT 0.7  PROT 6.3*  ALBUMIN 3.8   No results for input(s): LIPASE, AMYLASE in the last 168 hours. No results for input(s): AMMONIA in the last 168 hours. Coagulation Profile: No results for input(s): INR, PROTIME in the last 168 hours. Cardiac Enzymes: No results for input(s): CKTOTAL, CKMB, CKMBINDEX, TROPONINI in the last 168 hours. BNP (last 3 results) No results for input(s): PROBNP in the last 8760 hours. HbA1C: No results for input(s): HGBA1C in the last 72 hours. CBG:  Recent Labs Lab 06/10/17 1718 06/10/17 2131 06/11/17 0750 06/11/17 1132  GLUCAP 128* 168* 117* 147*   Lipid Profile: No results for input(s): CHOL, HDL, LDLCALC, TRIG, CHOLHDL, LDLDIRECT in the last 72 hours. Thyroid Function Tests:  Recent Labs  06/10/17 0107  TSH 0.829  FREET4 0.71   Anemia Panel: No results for input(s): VITAMINB12, FOLATE, FERRITIN, TIBC, IRON, RETICCTPCT in the last 72 hours. Urine analysis:    Component Value Date/Time   COLORURINE STRAW (A) 06/10/2017 0348   APPEARANCEUR CLEAR 06/10/2017 0348   LABSPEC 1.005 06/10/2017 0348   PHURINE 6.0 06/10/2017 0348   GLUCOSEU NEGATIVE 06/10/2017 0348   HGBUR NEGATIVE 06/10/2017 0348   BILIRUBINUR NEGATIVE 06/10/2017 0348   KETONESUR NEGATIVE 06/10/2017 0348   PROTEINUR NEGATIVE 06/10/2017 0348   NITRITE NEGATIVE 06/10/2017 0348   LEUKOCYTESUR NEGATIVE 06/10/2017 0348     )No results found for this or any previous visit (from the past 240 hour(s)).    Anti-infectives    None       Radiology Studies: Ct Angio Chest Pe W Or Wo Contrast  Result Date: 06/10/2017 CLINICAL DATA:  Body ache.  Positive D-dimer. EXAM: CT ANGIOGRAPHY CHEST WITH CONTRAST TECHNIQUE: Multidetector CT imaging of the chest was performed using the standard protocol during bolus administration of intravenous contrast. Multiplanar CT image reconstructions and MIPs were obtained to evaluate the vascular  anatomy. CONTRAST:  80 cc Isovue 370 COMPARISON:  None. FINDINGS: Cardiovascular: There are no filling defects in the pulmonary arterial tree. Mild LAD territory coronary artery calcification. The right ventricle and atrium are mildly dilated. Mediastinum/Nodes: No abnormal mediastinal mass effect or adenopathy. Lungs/Pleura: Dependent atelectasis. Patchy density in the medial right middle lobe is again most consistent with subsegmental atelectasis. No pneumothorax. No pleural effusion. Upper Abdomen: No acute abnormality. Musculoskeletal: No acute bony deformity. Review of the MIP images confirms the above findings. IMPRESSION: No evidence of pulmonary embolism. Dilatation of the right atrium and right ventricle is of unknown significance but may simply represent volume overload. Correlate clinically as for the need for further imaging.  Electronically Signed   By: Jolaine Click M.D.   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 COMPARISON:  CT 06/10/2017 FINDINGS: Parenchymal Echotexture: Mildly heterogenous Isthmus: 0.3 cm thickness Right lobe: 4.7 x 2.1 x 1.5 cm Left lobe: 4.4 x 2.1 x 1.6 cm _________________________________________________________ Estimated total number of nodules >/= 1 cm: 0 Number of spongiform nodules >/=  2 cm not described below (TR1): 0 Number of mixed cystic and solid nodules >/= 1.5 cm not described below (TR2): 0 _________________________________________________________ No discrete nodules are seen within the thyroid gland. IMPRESSION: Negative. The above is in keeping with the ACR TI-RADS recommendations - J Am Coll Radiol 2017;14:587-595. Electronically Signed   By: Corlis Leak M.D.   On: 06/10/2017 12:31        Scheduled Meds: . diphenhydrAMINE  50 mg Oral Q6H  .  famotidine  20 mg Oral BID  . hydrOXYzine  50 mg Oral TID  . Influenza vac split quadrivalent PF  0.5 mL Intramuscular Tomorrow-1000  . insulin aspart  0-5 Units Subcutaneous QHS  . insulin aspart  0-9 Units Subcutaneous TID WC  . levothyroxine  50 mcg Oral QAC breakfast  . loratadine  10 mg Oral Daily  . methylPREDNISolone (SOLU-MEDROL) injection  125 mg Intravenous Q6H   Continuous Infusions:   LOS: 0 days    Time spent: 35 min    Jordis Repetto U Robbert Langlinais, DO [facility] Pager [phone]  If 7PM-7AM, please contact night-coverage [email or web address] Password [email or web address] 06/11/2017, 2:14 PM

## 2017-06-12 ENCOUNTER — Inpatient Hospital Stay (HOSPITAL_COMMUNITY): Payer: BLUE CROSS/BLUE SHIELD

## 2017-06-12 DIAGNOSIS — T7840XA Allergy, unspecified, initial encounter: Secondary | ICD-10-CM | POA: Diagnosis not present

## 2017-06-12 DIAGNOSIS — L509 Urticaria, unspecified: Secondary | ICD-10-CM | POA: Diagnosis not present

## 2017-06-12 DIAGNOSIS — E079 Disorder of thyroid, unspecified: Secondary | ICD-10-CM | POA: Diagnosis not present

## 2017-06-12 DIAGNOSIS — I10 Essential (primary) hypertension: Secondary | ICD-10-CM | POA: Diagnosis not present

## 2017-06-12 DIAGNOSIS — K219 Gastro-esophageal reflux disease without esophagitis: Secondary | ICD-10-CM | POA: Diagnosis not present

## 2017-06-12 DIAGNOSIS — Z2809 Immunization not carried out because of other contraindication: Secondary | ICD-10-CM | POA: Diagnosis not present

## 2017-06-12 DIAGNOSIS — T783XXA Angioneurotic edema, initial encounter: Secondary | ICD-10-CM | POA: Diagnosis not present

## 2017-06-12 DIAGNOSIS — D72829 Elevated white blood cell count, unspecified: Secondary | ICD-10-CM | POA: Diagnosis not present

## 2017-06-12 LAB — GLUCOSE, CAPILLARY
GLUCOSE-CAPILLARY: 126 mg/dL — AB (ref 65–99)
GLUCOSE-CAPILLARY: 143 mg/dL — AB (ref 65–99)
Glucose-Capillary: 121 mg/dL — ABNORMAL HIGH (ref 65–99)
Glucose-Capillary: 185 mg/dL — ABNORMAL HIGH (ref 65–99)

## 2017-06-12 LAB — TROPONIN I: Troponin I: <0.03 ng/mL (ref <0.03)

## 2017-06-12 MED ORDER — MENTHOL 3 MG MT LOZG
1.0000 | LOZENGE | OROMUCOSAL | Status: DC | PRN
  Filled 2017-06-12: qty 9

## 2017-06-12 MED ORDER — METHYLPREDNISOLONE SODIUM SUCC 125 MG IJ SOLR
125.0000 mg | Freq: Four times a day (QID) | INTRAMUSCULAR | Status: DC
  Administered 2017-06-12 – 2017-06-13 (×4): 125 mg via INTRAVENOUS
  Filled 2017-06-12 (×4): qty 2

## 2017-06-12 MED ORDER — FAMOTIDINE 20 MG PO TABS
40.0000 mg | ORAL_TABLET | Freq: Two times a day (BID) | ORAL | Status: DC
  Administered 2017-06-12 – 2017-06-13 (×2): 40 mg via ORAL
  Filled 2017-06-12 (×2): qty 2

## 2017-06-12 MED ORDER — DIPHENHYDRAMINE HCL 25 MG PO CAPS
50.0000 mg | ORAL_CAPSULE | Freq: Four times a day (QID) | ORAL | Status: DC
  Administered 2017-06-12 – 2017-06-13 (×3): 50 mg via ORAL
  Filled 2017-06-12 (×3): qty 2

## 2017-06-12 MED ORDER — METHYLPREDNISOLONE SODIUM SUCC 125 MG IJ SOLR
60.0000 mg | Freq: Once | INTRAMUSCULAR | Status: AC
Start: 2017-06-12 — End: 2017-06-12
  Administered 2017-06-12: 60 mg via INTRAVENOUS
  Filled 2017-06-12: qty 2

## 2017-06-12 MED ORDER — HYDROCOD POLST-CPM POLST ER 10-8 MG/5ML PO SUER
5.0000 mL | Freq: Once | ORAL | Status: AC
  Administered 2017-06-12: 5 mL via ORAL
  Filled 2017-06-12: qty 5

## 2017-06-12 NOTE — Progress Notes (Signed)
Patient c/o consistent 7/10 pain with inspiration. Patient using incentive spirometer and O2 sat 90-96%. Patient does have dyspnea with exertion to and from bathroom. MD notified. 12 lead EKG and cardiac enzymes ordered. Patient and patient family updated and v/u. RN will continue to monitor.

## 2017-06-12 NOTE — Progress Notes (Signed)
PROGRESS NOTE    Christian Hays  ZOX:[id number] DOB: [birth year] DOA: 06/09/2017 PCP: Caffie Damme, MD   Outpatient Specialists:     Brief Narrative:  Christian Hays is a 51 y.o. male  with past medical history of hypothyroidism of unknown cause, hypertension and reflux presents emergency room with chief complaint of rash and swelling. Patient states he ate a a biscuit and [employer] on Saturday and then developed symptoms of rash and swelling. Symptoms worsened. Patient occasionally feels short of breath. No previous history of this type of reaction. No family history of the reaction. Denies any new medications or exposures. No history of tick bites. No recent bug bites. No family history of similar reaction.  ED course: Allergy medications given for anaphylaxis including epinephrine. Patient felt somewhat better. Hospitalists asked to admit patient due to 3 consecutive emergency room visits in the last day.   Assessment & Plan:   Principal Problem:   Urticaria Active Problems:   Elevated WBC count   GERD (gastroesophageal reflux disease)   Urticaria w/ angioedema -will increase steroid dose as he felt better after that -no stridor -will need allergy testing as an outpatient -bendaryl/pepcid/claritin/steroids/epi/hydroxizine -discussed with PCCM, as no sign of airway compromise, will not get consult but if patient's worsens, notify sooner rather than later for possible intubation -d dimer elevated due to allergic rxn  Elevated wbc Suspect from steroids  GERD -pepcid  Hypothyroidism Cont OP synthroid 50 mcg qd -TSH and other labs normal  Hyperglycemia -due to steroids -carb mod diet here with SSI  DVT prophylaxis:  SCD's  Code Status: Full Code   Family Communication: At bedside  Disposition Plan:     Consultants:         Subjective: After high dose IV steroids yesterday, itching was improved-- now worsening, no lip  swelling  Objective: Vitals:   06/12/17 0406 06/12/17 0600 06/12/17 0800 06/12/17 1000  BP:  95/63    Pulse:  (!) 59    Resp:  17    Temp: 97.9 F (36.6 C)  97.8 F (36.6 C) 97.7 F (36.5 C)  TempSrc: Oral     SpO2:  95%    Weight: 72.4 kg (159 lb 9.8 oz)     Height:        Intake/Output Summary (Last 24 hours) at 06/12/17 1400 Last data filed at 06/12/17 0800  Gross per 24 hour  Intake             1735 ml  Output                0 ml  Net             1735 ml   Filed Weights   06/10/17 0243 06/11/17 0458 06/12/17 0406  Weight: 69.6 kg (153 lb 7 oz) 72.7 kg (160 lb 4.4 oz) 72.4 kg (159 lb 9.8 oz)    Examination:  General exam: NAD- comfortable Respiratory system: clear, no stridor Cardiovascular system: rrr Gastrointestinal system: +Bs, soft Central nervous system: Alert and oriented. No focal neurological deficits. Extremities: moves all 4 ext Skin: red rash but not raised, none on back, lessened on chest/abd/legs Psychiatry: normal mood    Data Reviewed: I have personally reviewed following labs and imaging studies  CBC:  Recent Labs Lab 06/09/17 2158 06/10/17 0107 06/10/17 0431  WBC 17.8* 16.3* 14.1*  NEUTROABS  --   --  13.0*  HGB 14.8 15.1 14.8  HCT 42.5 43.1 41.8  MCV 86.0 86.0 86.7  PLT 236 241 232   Basic Metabolic Panel:  Recent Labs Lab 06/09/17 2158 06/10/17 0107 06/10/17 0431  NA 137  --  137  K 3.9  --  4.8  CL 106  --  106  CO2 22  --  24  GLUCOSE 150*  --  174*  BUN 19  --  19  CREATININE 1.16 1.10 0.89  CALCIUM 8.4*  --  8.5*   GFR: Estimated Creatinine Clearance: 99.4 mL/min (by C-G formula based on SCr of 0.89 mg/dL). Liver Function Tests:  Recent Labs Lab 06/10/17 0107  AST 62*  ALT 55  ALKPHOS 66  BILITOT 0.7  PROT 6.3*  ALBUMIN 3.8   No results for input(s): LIPASE, AMYLASE in the last 168 hours. No results for input(s): AMMONIA in the last 168 hours. Coagulation Profile: No results for input(s): INR,  PROTIME in the last 168 hours. Cardiac Enzymes: No results for input(s): CKTOTAL, CKMB, CKMBINDEX, TROPONINI in the last 168 hours. BNP (last 3 results) No results for input(s): PROBNP in the last 8760 hours. HbA1C: No results for input(s): HGBA1C in the last 72 hours. CBG:  Recent Labs Lab 06/11/17 1132 06/11/17 1605 06/11/17 2134 06/12/17 0740 06/12/17 1131  GLUCAP 147* 138* 159* 126* 121*   Lipid Profile: No results for input(s): CHOL, HDL, LDLCALC, TRIG, CHOLHDL, LDLDIRECT in the last 72 hours. Thyroid Function Tests:  Recent Labs  06/10/17 0107  TSH 0.829  FREET4 0.71   Anemia Panel: No results for input(s): VITAMINB12, FOLATE, FERRITIN, TIBC, IRON, RETICCTPCT in the last 72 hours. Urine analysis:    Component Value Date/Time   COLORURINE STRAW (A) 06/10/2017 0348   APPEARANCEUR CLEAR 06/10/2017 0348   LABSPEC 1.005 06/10/2017 0348   PHURINE 6.0 06/10/2017 0348   GLUCOSEU NEGATIVE 06/10/2017 0348   HGBUR NEGATIVE 06/10/2017 0348   BILIRUBINUR NEGATIVE 06/10/2017 0348   KETONESUR NEGATIVE 06/10/2017 0348   PROTEINUR NEGATIVE 06/10/2017 0348   NITRITE NEGATIVE 06/10/2017 0348   LEUKOCYTESUR NEGATIVE 06/10/2017 0348     )No results found for this or any previous visit (from the past 240 hour(s)).    Anti-infectives    None       Radiology Studies: No results found.      Scheduled Meds: . diphenhydrAMINE  50 mg Oral Q6H  . famotidine  40 mg Oral BID  . hydrOXYzine  50 mg Oral TID  . insulin aspart  0-5 Units Subcutaneous QHS  . insulin aspart  0-9 Units Subcutaneous TID WC  . levothyroxine  50 mcg Oral QAC breakfast  . loratadine  10 mg Oral Daily  . methylPREDNISolone (SOLU-MEDROL) injection  125 mg Intravenous Q6H   Continuous Infusions:   LOS: 1 day    Time spent: 35 min    Eran Mistry U Sarvesh Meddaugh, DO [facility] Pager [phone]  If 7PM-7AM, please contact night-coverage [email or web address] Password [email or web address] 06/12/2017, 2:00 PM

## 2017-06-13 ENCOUNTER — Other Ambulatory Visit (HOSPITAL_COMMUNITY): Payer: BLUE CROSS/BLUE SHIELD

## 2017-06-13 DIAGNOSIS — T7840XA Allergy, unspecified, initial encounter: Secondary | ICD-10-CM | POA: Diagnosis not present

## 2017-06-13 DIAGNOSIS — L509 Urticaria, unspecified: Secondary | ICD-10-CM | POA: Diagnosis not present

## 2017-06-13 DIAGNOSIS — E079 Disorder of thyroid, unspecified: Secondary | ICD-10-CM | POA: Diagnosis not present

## 2017-06-13 DIAGNOSIS — D72829 Elevated white blood cell count, unspecified: Secondary | ICD-10-CM | POA: Diagnosis not present

## 2017-06-13 LAB — GLUCOSE, CAPILLARY
GLUCOSE-CAPILLARY: 139 mg/dL — AB (ref 65–99)
Glucose-Capillary: 116 mg/dL — ABNORMAL HIGH (ref 65–99)

## 2017-06-13 MED ORDER — DIPHENHYDRAMINE-ZINC ACETATE 2-0.1 % EX CREA: TOPICAL_CREAM | Freq: Three times a day (TID) | CUTANEOUS | 0 refills | Status: DC | PRN

## 2017-06-13 MED ORDER — LORATADINE 10 MG PO TABS: 10.0000 mg | ORAL_TABLET | Freq: Every day | ORAL | 0 refills | Status: DC

## 2017-06-13 MED ORDER — DIPHENHYDRAMINE HCL 50 MG PO CAPS: 50.0000 mg | ORAL_CAPSULE | Freq: Four times a day (QID) | ORAL | 0 refills | Status: DC

## 2017-06-13 MED ORDER — PREDNISONE 10 MG PO TABS: ORAL_TABLET | ORAL | 0 refills | Status: DC

## 2017-06-13 MED ORDER — PREDNISONE 20 MG PO TABS: 40.0000 mg | ORAL_TABLET | Freq: Every day | ORAL | Status: DC

## 2017-06-13 MED ORDER — EPINEPHRINE 0.3 MG/0.3ML IJ SOAJ: 0.3000 mg | Freq: Once | INTRAMUSCULAR | 0 refills | Status: AC

## 2017-06-13 MED ORDER — HYDROXYZINE HCL 50 MG PO TABS: 50.0000 mg | ORAL_TABLET | Freq: Three times a day (TID) | ORAL | 0 refills | Status: DC

## 2017-06-13 MED ORDER — FAMOTIDINE 40 MG PO TABS: 40.0000 mg | ORAL_TABLET | Freq: Two times a day (BID) | ORAL | 0 refills | Status: DC

## 2017-06-13 NOTE — Discharge Summary (Signed)
Physician Discharge Summary  Christian Hays ZOX:[id number] DOB: 1965-12-27 DOA: 06/09/2017  PCP: Caffie Damme, MD  Admit date: 06/09/2017 Discharge date: 06/13/2017   Recommendations for Outpatient Follow-Up:   1. Outpatient echo/cardiology referral 2. Allergy testing with referral to allergist 3. Have schedule the histamine blockers, will need to change to PRN at next visit   Discharge Diagnosis:   Principal Problem:   Urticaria Active Problems:   Elevated WBC count   GERD (gastroesophageal reflux disease)   Discharge disposition:  Home.  Discharge Condition: Improved.  Diet recommendation: Low sodium, heart healthy.  Carbohydrate-modified  Wound care: None.   History of Present Illness:   Christian Hays is a 51 y.o. male  with past medical history of hypothyroidism of unknown cause, hypertension and reflux presents emergency room with chief complaint of rash and swelling. Patient states he ate a a biscuit and [employer] on Saturday and then developed symptoms of rash and swelling. Symptoms worsened. Patient occasionally feels short of breath. No previous history of this type of reaction. No family history of the reaction. Denies any new medications or exposures. No history of tick bites. No recent bug bites. No family history of similar reaction.  ED course: Allergy medications given for anaphylaxis including epinephrine. Patient felt somewhat better. Hospitalists asked to admit patient due to 3 consecutive emergency room visits in the last day.   Hospital Course by Problem:   Urticaria w/ angioedema -required high dose steroids for symptoms to resolve -no stridor -will need allergy testing as an outpatient -bendaryl/pepcid/claritin/steroids/epi/hydroxizine -d dimer elevated due to allergic rxn-- CTA negative  Elevated wbc Suspect from steroids  GERD -pepcid  Hypothyroidism Cont OP synthroid qd -TSH and other labs normal  Hyperglycemia -due  to steroids -carb mod diet while on steroids  Dyspnea -x ray negative -EKG normal -CE negative -echo as an outpatient    Medical Consultants:    None.   Discharge Exam:   Vitals:   06/13/17 1136 06/13/17 1200  BP:  99/70  Pulse:  (!) 54  Resp:  19  Temp: 97.9 F (36.6 C)   SpO2:  95%   Vitals:   06/13/17 0800 06/13/17 0811 06/13/17 1136 06/13/17 1200  BP: 101/76   99/70  Pulse: 64   (!) 54  Resp: 17   19  Temp:  97.9 F (36.6 C) 97.9 F (36.6 C)   TempSrc:  Oral Oral   SpO2: 96%   95%  Weight:      Height:        Gen:  NAD- improved and thinks he can be managed at home    The results of significant diagnostics from this hospitalization (including imaging, microbiology, ancillary and laboratory) are listed below for reference.     Procedures and Diagnostic Studies:   Ct Angio Chest Pe W Or Wo Contrast  Result Date: 06/10/2017 CLINICAL DATA:  Body ache.  Positive D-dimer. EXAM: CT ANGIOGRAPHY CHEST WITH CONTRAST TECHNIQUE: Multidetector CT imaging of the chest was performed using the standard protocol during bolus administration of intravenous contrast. Multiplanar CT image reconstructions and MIPs were obtained to evaluate the vascular anatomy. CONTRAST:  80 cc Isovue 370 COMPARISON:  None. FINDINGS: Cardiovascular: There are no filling defects in the pulmonary arterial tree. Mild LAD territory coronary artery calcification. The right ventricle and atrium are mildly dilated. Mediastinum/Nodes: No abnormal mediastinal mass effect or adenopathy. Lungs/Pleura: Dependent atelectasis. Patchy density in the medial right middle lobe is again most consistent with subsegmental atelectasis. No  pneumothorax. No pleural effusion. Upper Abdomen: No acute abnormality. Musculoskeletal: No acute bony deformity. Review of the MIP images confirms the above findings. IMPRESSION: No evidence of pulmonary embolism. Dilatation of the right atrium and right ventricle is of unknown  significance but may simply represent volume overload. Correlate clinically as for the need for further imaging. Electronically Signed   By: Jolaine Click M.D.   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 COMPARISON:  CT 06/10/2017 FINDINGS: Parenchymal Echotexture: Mildly heterogenous Isthmus: 0.3 cm thickness Right lobe: 4.7 x 2.1 x 1.5 cm Left lobe: 4.4 x 2.1 x 1.6 cm _________________________________________________________ Estimated total number of nodules >/= 1 cm: 0 Number of spongiform nodules >/=  2 cm not described below (TR1): 0 Number of mixed cystic and solid nodules >/= 1.5 cm not described below (TR2): 0 _________________________________________________________ No discrete nodules are seen within the thyroid gland. IMPRESSION: Negative. The above is in keeping with the ACR TI-RADS recommendations - J Am Coll Radiol 2017;14:587-595. Electronically Signed   By: Corlis Leak M.D.   On: 06/10/2017 12:31     Labs:   Basic Metabolic Panel:  Recent Labs Lab 06/09/17 2158 06/10/17 0107 06/10/17 0431  NA 137  --  137  K 3.9  --  4.8  CL 106  --  106  CO2 22  --  24  GLUCOSE 150*  --  174*  BUN 19  --  19  CREATININE 1.16 1.10 0.89  CALCIUM 8.4*  --  8.5*   GFR Estimated Creatinine Clearance: 99.4 mL/min (by C-G formula based on SCr of 0.89 mg/dL). Liver Function Tests:  Recent Labs Lab 06/10/17 0107  AST 62*  ALT 55  ALKPHOS 66  BILITOT 0.7  PROT 6.3*  ALBUMIN 3.8   No results for input(s): LIPASE, AMYLASE in the last 168 hours. No results for input(s): AMMONIA in the last 168 hours. Coagulation profile No results for input(s): INR, PROTIME in the last 168 hours.  CBC:  Recent Labs Lab 06/09/17 2158 06/10/17 0107 06/10/17 0431  WBC 17.8* 16.3* 14.1*    NEUTROABS  --   --  13.0*  HGB 14.8 15.1 14.8  HCT 42.5 43.1 41.8  MCV 86.0 86.0 86.7  PLT 236 241 232   Cardiac Enzymes:  Recent Labs Lab 06/12/17 1918  TROPONINI <0.03   BNP: Invalid input(s): POCBNP CBG:  Recent Labs Lab 06/12/17 1131 06/12/17 1638 06/12/17 2210 06/13/17 0648 06/13/17 1135  GLUCAP 121* 185* 143* 116* 139*   D-Dimer No results for input(s): DDIMER in the last 72 hours. Hgb A1c No results for input(s): HGBA1C in the last 72 hours. Lipid Profile No results for input(s): CHOL, HDL, LDLCALC, TRIG, CHOLHDL, LDLDIRECT in the last 72 hours. Thyroid function studies No results for input(s): TSH, T4TOTAL, T3FREE, THYROIDAB in the last 72 hours.  Invalid input(s): FREET3 Anemia work up No results for input(s): VITAMINB12, FOLATE, FERRITIN, TIBC, IRON, RETICCTPCT in the last 72 hours. Microbiology No results found for this or any previous visit (from the past 240 hour(s)).   Discharge Instructions:   Discharge Instructions    Diet - low sodium heart healthy    Complete by:  As directed    Discharge instructions    Complete by:  As directed    Echo as an outpatient Referral to allergy  physician for testing   Increase activity slowly    Complete by:  As directed      Allergies as of 06/13/2017   No Known Allergies     Medication List    STOP taking these medications   diphenhydrAMINE 25 MG tablet Commonly known as:  BENADRYL Replaced by:  diphenhydrAMINE 50 MG capsule   LORazepam 1 MG tablet Commonly known as:  ATIVAN   omeprazole 40 MG capsule Commonly known as:  PRILOSEC     TAKE these medications   diphenhydrAMINE 50 MG capsule Commonly known as:  BENADRYL Take 1 capsule (50 mg total) by mouth 4 (four) times daily. Replaces:  diphenhydrAMINE 25 MG tablet   diphenhydrAMINE-zinc acetate cream Commonly known as:  BENADRYL Apply topically 3 (three) times daily as needed for itching.   EPINEPHrine 0.3 mg/0.3 mL Soaj  injection Commonly known as:  EPIPEN 2-PAK Inject 0.3 mLs (0.3 mg total) into the muscle once. For allergic reactions   famotidine 40 MG tablet Commonly known as:  PEPCID Take 1 tablet (40 mg total) by mouth 2 (two) times daily. What changed:  medication strength  how much to take   hydrOXYzine 50 MG tablet Commonly known as:  ATARAX/VISTARIL Take 1 tablet (50 mg total) by mouth 3 (three) times daily.   levothyroxine 50 MCG tablet Commonly known as:  SYNTHROID, LEVOTHROID Take 50 mcg by mouth daily.   loratadine 10 MG tablet Commonly known as:  CLARITIN Take 1 tablet (10 mg total) by mouth daily.   predniSONE 10 MG tablet Commonly known as:  DELTASONE 40 mg PO x 2 days then 30 mg PO x 2 days then 20 mg PO x 2 days then  PO x 2 days then stop What changed:  medication strength  how much to take  how to take this  when to take this  additional instructions      Follow-up Information    Caffie Damme, MD Follow up in 1 week(s).   Specialty:  Family Medicine Why:  for outpatient echo and referral to outpatinet allergy Contact information: [address] [place] [address] [phone]            Time coordinating discharge: 35 min  Signed:  Daren Doswell U Garlon Tuggle   [facility] 06/13/2017, 3:11 PM

## 2017-06-13 NOTE — Progress Notes (Signed)
Patient IV removed. Discharge instructions given to patient and wife, questions answered. Patient taken to car via wheel chair.

## 2018-04-12 ENCOUNTER — Emergency Department (HOSPITAL_BASED_OUTPATIENT_CLINIC_OR_DEPARTMENT_OTHER): Payer: BLUE CROSS/BLUE SHIELD

## 2018-04-12 ENCOUNTER — Other Ambulatory Visit: Payer: Self-pay

## 2018-04-12 ENCOUNTER — Emergency Department (HOSPITAL_BASED_OUTPATIENT_CLINIC_OR_DEPARTMENT_OTHER)
Admission: EM | Admit: 2018-04-12 | Discharge: 2018-04-12 | Disposition: A | Discharge status: Home / Self Care | Payer: BLUE CROSS/BLUE SHIELD | Attending: Emergency Medicine | Admitting: Emergency Medicine

## 2018-04-12 ENCOUNTER — Encounter (HOSPITAL_BASED_OUTPATIENT_CLINIC_OR_DEPARTMENT_OTHER): Payer: Self-pay | Admitting: Emergency Medicine

## 2018-04-12 DIAGNOSIS — R52 Pain, unspecified: Secondary | ICD-10-CM

## 2018-04-12 DIAGNOSIS — R1013 Epigastric pain: Secondary | ICD-10-CM | POA: Diagnosis not present

## 2018-04-12 DIAGNOSIS — I10 Essential (primary) hypertension: Secondary | ICD-10-CM | POA: Insufficient documentation

## 2018-04-12 DIAGNOSIS — Z79899 Other long term (current) drug therapy: Secondary | ICD-10-CM | POA: Diagnosis not present

## 2018-04-12 LAB — COMPREHENSIVE METABOLIC PANEL
ALBUMIN: 4.7 g/dL (ref 3.5–5.0)
ALK PHOS: 62 U/L (ref 38–126)
ALT: 26 U/L (ref 0–44)
AST: 36 U/L (ref 15–41)
Anion gap: 8 (ref 5–15)
BILIRUBIN TOTAL: 1.3 mg/dL — AB (ref 0.3–1.2)
BUN: 21 mg/dL — ABNORMAL HIGH (ref 6–20)
CALCIUM: 9 mg/dL (ref 8.9–10.3)
CO2: 26 mmol/L (ref 22–32)
Chloride: 104 mmol/L (ref 98–111)
Creatinine, Ser: 1.15 mg/dL (ref 0.61–1.24)
GFR calc Af Amer: >60 mL/min (ref >60)
GFR calc non Af Amer: >60 mL/min (ref >60)
GLUCOSE: 71 mg/dL (ref 70–99)
Potassium: 4.7 mmol/L (ref 3.5–5.1)
Sodium: 138 mmol/L (ref 135–145)
TOTAL PROTEIN: 7.6 g/dL (ref 6.5–8.1)

## 2018-04-12 LAB — URINALYSIS, ROUTINE W REFLEX MICROSCOPIC
BILIRUBIN URINE: NEGATIVE
Glucose, UA: NEGATIVE mg/dL
HGB URINE DIPSTICK: NEGATIVE
KETONES UR: NEGATIVE mg/dL
Leukocytes, UA: NEGATIVE
NITRITE: NEGATIVE
PH: 5.5 (ref 5.0–8.0)
Protein, ur: NEGATIVE mg/dL
SPECIFIC GRAVITY, URINE: 1.025 (ref 1.005–1.030)

## 2018-04-12 LAB — CBC
HEMATOCRIT: 45.8 % (ref 39.0–52.0)
Hemoglobin: 16.3 g/dL (ref 13.0–17.0)
MCH: 30 pg (ref 26.0–34.0)
MCHC: 35.6 g/dL (ref 30.0–36.0)
MCV: 84.2 fL (ref 78.0–100.0)
Platelets: 244 10*3/uL (ref 150–400)
RBC: 5.44 MIL/uL (ref 4.22–5.81)
RDW: 12.8 % (ref 11.5–15.5)
WBC: 6.3 10*3/uL (ref 4.0–10.5)

## 2018-04-12 LAB — LIPASE, BLOOD: Lipase: 31 U/L (ref 11–51)

## 2018-04-12 MED ORDER — MORPHINE SULFATE (PF) 4 MG/ML IV SOLN
4.0000 mg | Freq: Once | INTRAVENOUS | Status: AC
  Administered 2018-04-12: 4 mg via INTRAVENOUS
  Filled 2018-04-12: qty 1

## 2018-04-12 MED ORDER — HYDROCODONE-ACETAMINOPHEN 5-325 MG PO TABS: 1.0000 | ORAL_TABLET | ORAL | 0 refills | Status: DC | PRN

## 2018-04-12 MED ORDER — ONDANSETRON HCL 4 MG/2ML IJ SOLN
4.0000 mg | Freq: Once | INTRAMUSCULAR | Status: AC
  Administered 2018-04-12: 4 mg via INTRAVENOUS
  Filled 2018-04-12: qty 2

## 2018-04-12 MED ORDER — SODIUM CHLORIDE 0.9 % IV BOLUS
1000.0000 mL | Freq: Once | INTRAVENOUS | Status: AC
  Administered 2018-04-12: 1000 mL via INTRAVENOUS

## 2018-04-12 MED ORDER — ONDANSETRON 4 MG PO TBDP: 4.0000 mg | ORAL_TABLET | Freq: Three times a day (TID) | ORAL | 0 refills | Status: DC | PRN

## 2018-04-12 NOTE — ED Triage Notes (Signed)
Upper abd pain x 4 days with nausea.

## 2018-04-12 NOTE — ED Provider Notes (Signed)
[facility] EMERGENCY DEPARTMENT Provider Note   CSN: [id number] Arrival date & time: 04/12/18  1346     History   Chief Complaint Chief Complaint  Patient presents with  . Abdominal Pain    HPI Christian Hays is a 52 y.o. male.  Pt presents to the ED today with upper abdominal pain and nausea.  The pt said sx have been going on since 8/6.  The pt said he can't eat anything w/o throwing up.  He has had this before and has had an egd and colonoscopy with GI.  The pt does have GERD and has been compliant with his meds.     Past Medical History:  Diagnosis Date  . GERD (gastroesophageal reflux disease)   . Hypertension   . Thyroid disease     Patient Active Problem List   Diagnosis Date Noted  . Urticaria 06/10/2017  . Elevated WBC count 06/10/2017  . GERD (gastroesophageal reflux disease) 06/10/2017    Past Surgical History:  Procedure Laterality Date  . APPENDECTOMY          Home Medications    Prior to Admission medications   Medication Sig Start Date End Date Taking? Authorizing Provider  diphenhydrAMINE (BENADRYL) 50 MG capsule Take 1 capsule (50 mg total) by mouth 4 (four) times daily. 06/13/17   Joseph Art, DO  diphenhydrAMINE-zinc acetate (BENADRYL) cream Apply topically 3 (three) times daily as needed for itching. 06/13/17   Joseph Art, DO  famotidine (PEPCID) 40 MG tablet Take 1 tablet (40 mg total) by mouth 2 (two) times daily. 06/13/17   Joseph Art, DO  HYDROcodone-acetaminophen (NORCO/VICODIN) 5-325 MG tablet Take 1 tablet by mouth every 4 (four) hours as needed. 04/12/18   Jacalyn Lefevre, MD  hydrOXYzine (ATARAX/VISTARIL) 50 MG tablet Take 1 tablet (50 mg total) by mouth 3 (three) times daily. 06/13/17   Joseph Art, DO  levothyroxine (SYNTHROID, LEVOTHROID) 50 MCG tablet Take 50 mcg by mouth daily. 03/10/16   [provider]  loratadine (CLARITIN) 10 MG tablet Take 1 tablet (10 mg total) by mouth daily. 06/14/17    Joseph Art, DO  ondansetron (ZOFRAN ODT) 4 MG disintegrating tablet Take 1 tablet (4 mg total) by mouth every 8 (eight) hours as needed. 04/12/18   Jacalyn Lefevre, MD  predniSONE (DELTASONE) 10 MG tablet 40 mg PO x 2 days then 30 mg PO x 2 days then 20 mg PO x 2 days then 10mg  PO x 2 days then stop 06/13/17   Joseph Art, DO    Family History No family history on file.  Social History Social History   Tobacco Use  . Smoking status: Never Smoker  . Smokeless tobacco: Never Used  Substance Use Topics  . Alcohol use: No  . Drug use: No     Allergies   Patient has no known allergies.   Review of Systems Review of Systems  Gastrointestinal: Positive for abdominal pain, nausea and vomiting.  All other systems reviewed and are negative.    Physical Exam Updated Vital Signs BP 108/87 (BP Location: Left Arm)   Pulse 65   Temp 97.7 F (36.5 C) (Oral)   Resp 16   Ht 5\' 6"  (1.676 m)   Wt 67.1 kg   SpO2 100%   BMI 23.89 kg/m   Physical Exam  Constitutional: He is oriented to person, place, and time. He appears well-developed and well-nourished.  HENT:  Head: Normocephalic and atraumatic.  Mouth/Throat:  Oropharynx is clear and moist.  Eyes: Pupils are equal, round, and reactive to light. EOM are normal.  Cardiovascular: Normal rate, regular rhythm, normal heart sounds and intact distal pulses.  Pulmonary/Chest: Effort normal and breath sounds normal.  Abdominal: Normal appearance. There is tenderness in the epigastric area.  Neurological: He is alert and oriented to person, place, and time.  Skin: Skin is warm. Capillary refill takes less than 2 seconds.  Psychiatric: He has a normal mood and affect. His behavior is normal.  Nursing note and vitals reviewed.    ED Treatments / Results  Labs (all labs ordered are listed, but only abnormal results are displayed) Labs Reviewed  COMPREHENSIVE METABOLIC PANEL - Abnormal; Notable for the following components:       Result Value   BUN 21 (*)    Total Bilirubin 1.3 (*)    All other components within normal limits  LIPASE, BLOOD  CBC  URINALYSIS, ROUTINE W REFLEX MICROSCOPIC    EKG None  Radiology [place] Abdomen Limited Ruq  Result Date: 04/12/2018 CLINICAL DATA:  Epigastric pain and nausea for 4 days. EXAM: ULTRASOUND ABDOMEN LIMITED RIGHT UPPER QUADRANT COMPARISON:  None. FINDINGS: Gallbladder: No gallstones or wall thickening visualized. No sonographic Murphy sign noted by sonographer. Common bile duct: Diameter: 2 mm Liver: No focal lesion identified. Within normal limits in parenchymal echogenicity. Portal vein is patent on color Doppler imaging with normal direction of blood flow towards the liver. IMPRESSION: Normal right upper quadrant ultrasound. Electronically Signed   By: Amie Portlandavid  Ormond M.D.   On: 04/12/2018 16:23    Procedures Procedures (including critical care time)  Medications Ordered in ED Medications  ondansetron (ZOFRAN) injection 4 mg (4 mg Intravenous Given 04/12/18 1520)  sodium chloride 0.9 % bolus 1,000 mL (1,000 mLs Intravenous New Bag/Given 04/12/18 1519)  morphine 4 MG/ML injection 4 mg (4 mg Intravenous Given 04/12/18 1519)     Initial Impression / Assessment and Plan / ED Course  I have reviewed the triage vital signs and the nursing notes.  Pertinent labs & imaging results that were available during my care of the patient were reviewed by me and considered in my medical decision making (see chart for details).     Pt is feeling much better.  He is stable for d/c.  Return if worse.  Final Clinical Impressions(s) / ED Diagnoses   Final diagnoses:  Pain  Epigastric pain    ED Discharge Orders         Ordered    ondansetron (ZOFRAN ODT) 4 MG disintegrating tablet  Every 8 hours PRN     04/12/18 1630    HYDROcodone-acetaminophen (NORCO/VICODIN) 5-325 MG tablet  Every 4 hours PRN     04/12/18 1630           Jacalyn LefevreHaviland, Vitaliy Eisenhour, MD 04/12/18 [phone]

## 2018-04-12 NOTE — ED Notes (Signed)
NAD at this time. Pt is stable and going home.  

## 2019-03-27 IMAGING — US US THYROID
1 series · 14 of 25 positions shown · non-contrast
Comparison: CT 06/10/2017

CLINICAL DATA: Thyroid disfigurement on exam, history of
hypothyroidism

EXAM:
THYROID ULTRASOUND
TECHNIQUE: Ultrasound examination of the thyroid gland and adjacent soft
tissues was performed.

[Series 1: us thyroid · 0.06mm/px · 14 of 35 slices shown]
[im 1/35]
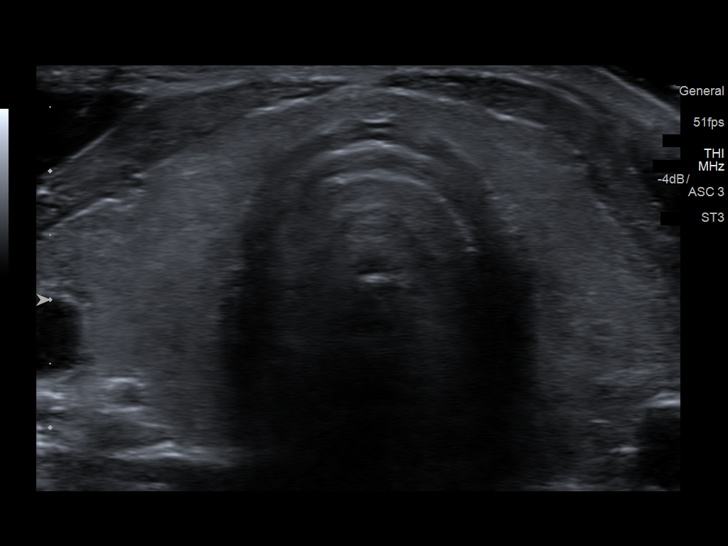
[im 3/35]
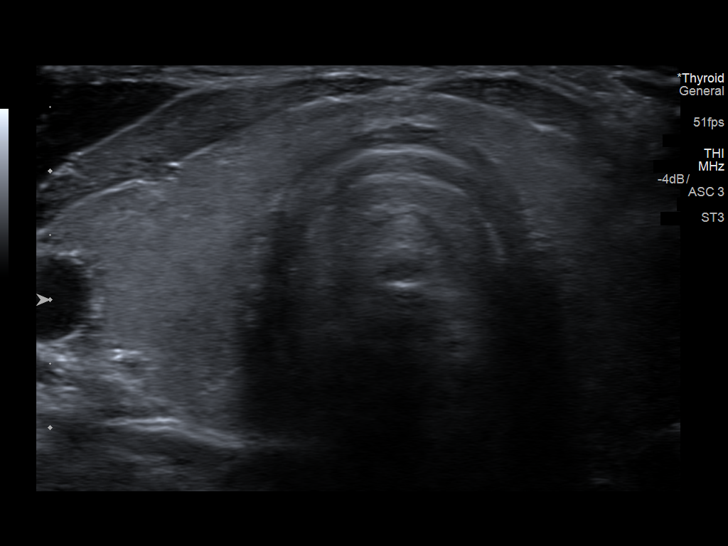
[im 6/35]
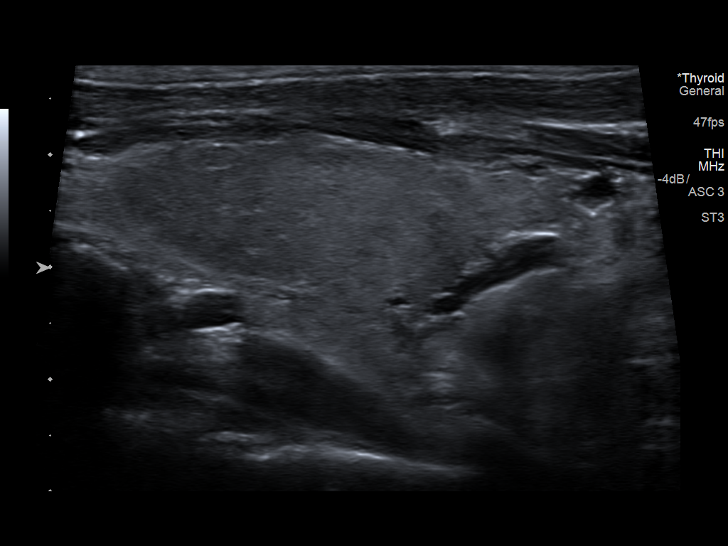
[im 9/35]
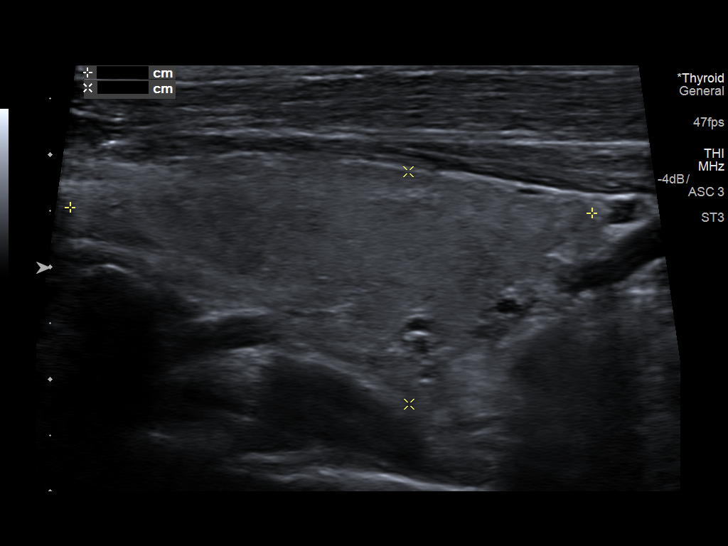
[im 12/35]
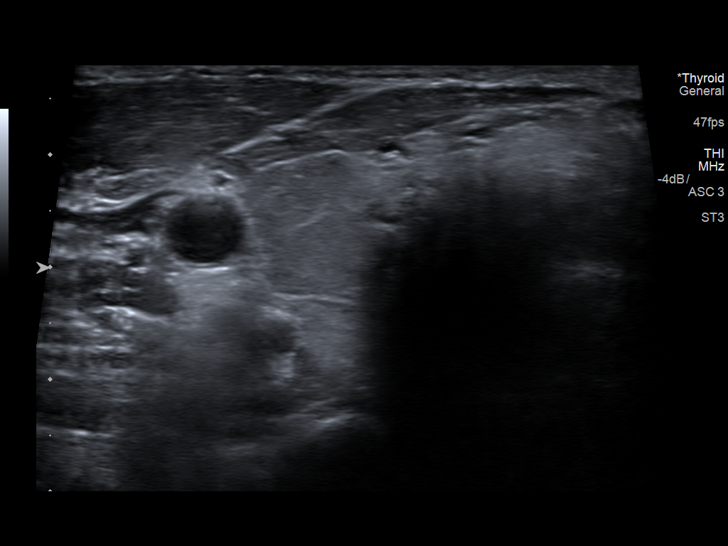
[im 13/35]
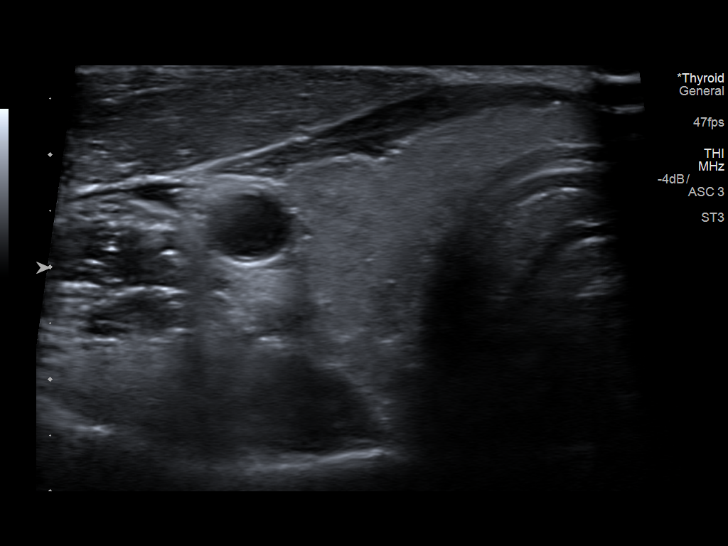
[im 16/35]
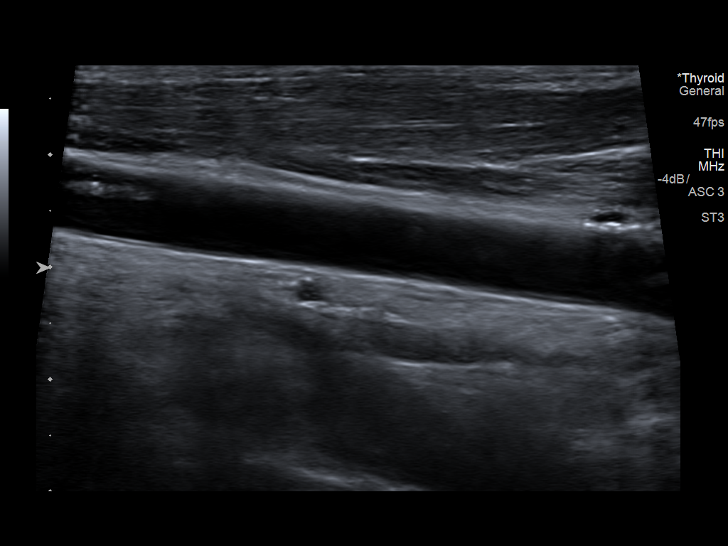
[im 19/35]
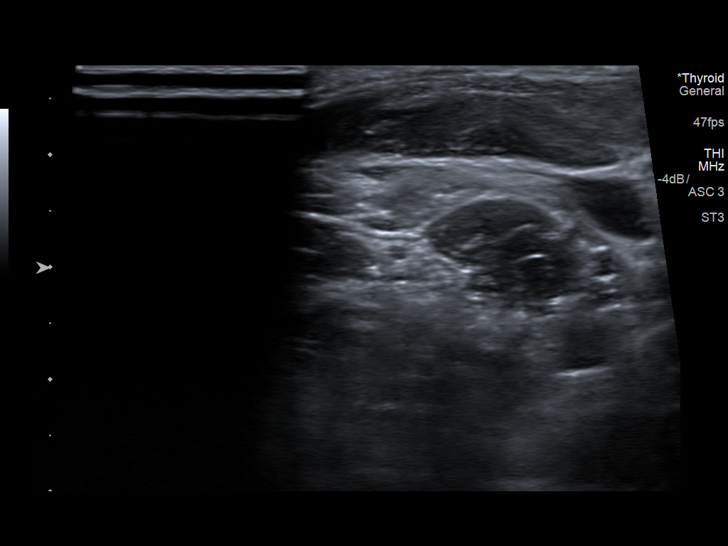
[im 22/35]
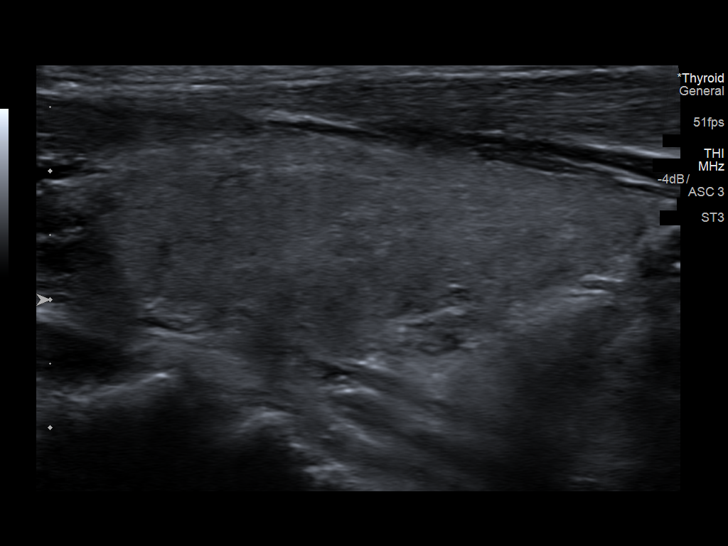
[im 23/35]
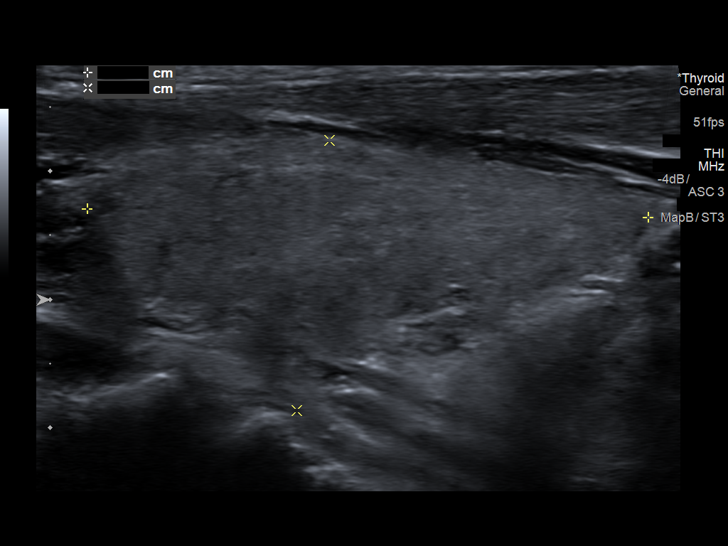
[im 26/35]
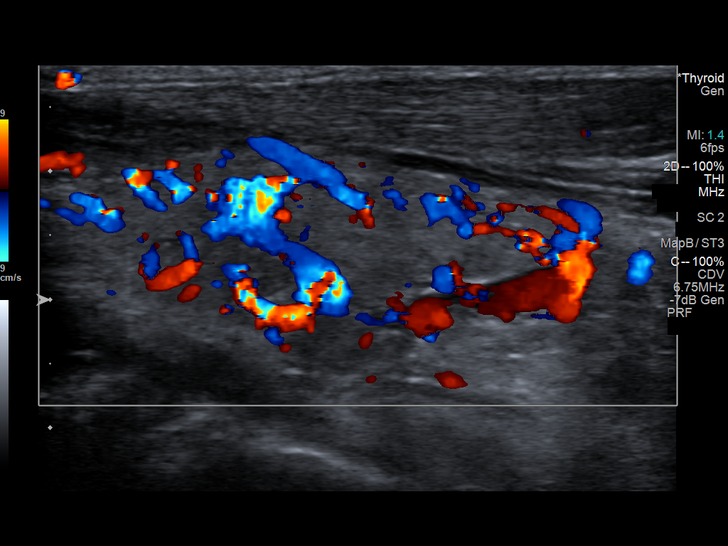
[im 29/35]
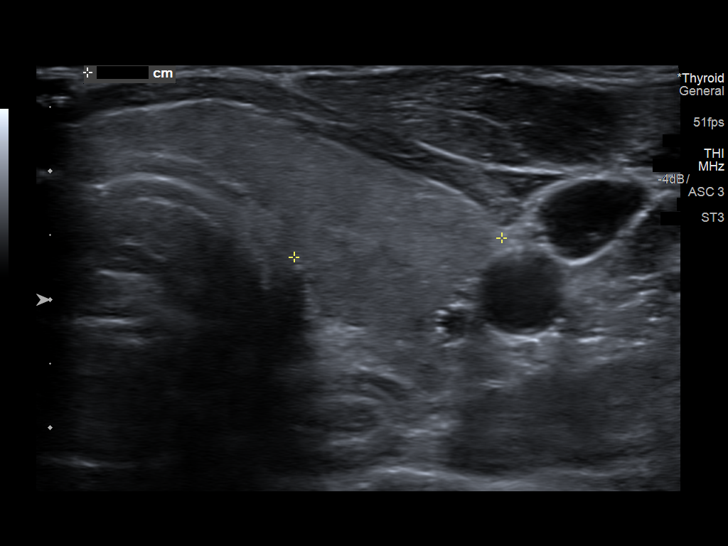
[im 32/35]
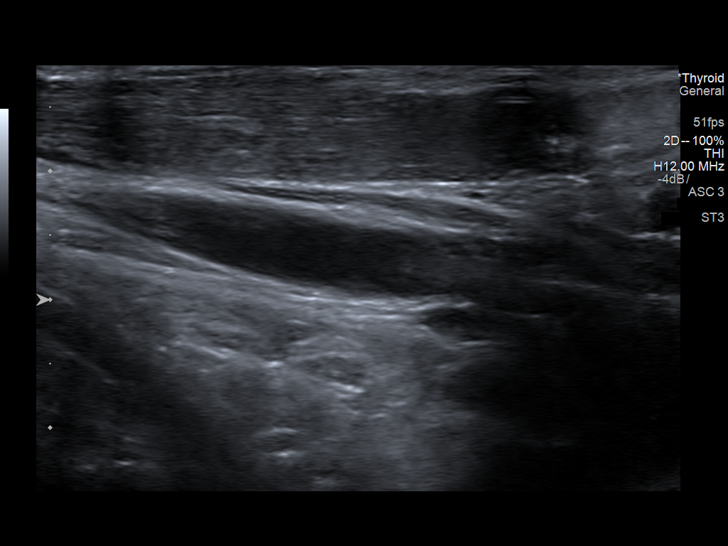
[im 35/35]
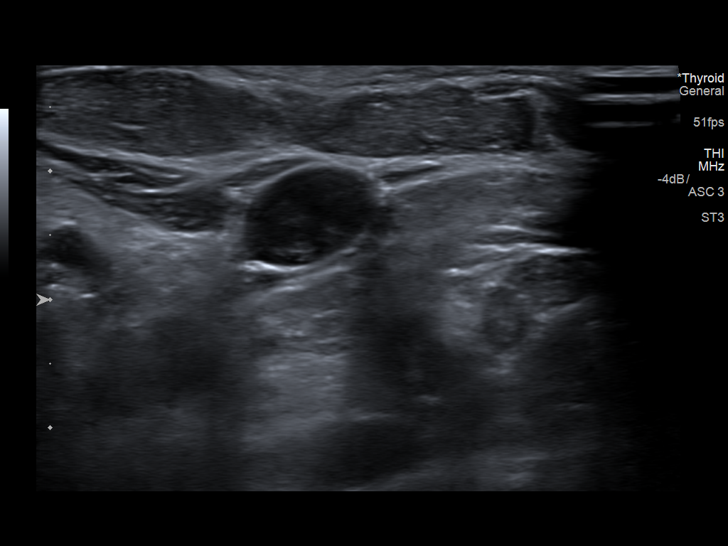

[14 of 25 positions shown; findings below may reference images not displayed]

FINDINGS: Parenchymal Echotexture: Mildly heterogenous

Isthmus: 0.3 cm thickness

Right lobe: 4.7 x 2.1 x 1.5 cm

Left lobe: 4.4 x 2.1 x 1.6 cm

_________________________________________________________

Estimated total number of nodules >/= 1 cm: 0

Number of spongiform nodules >/=  2 cm not described below (TR1): 0

Number of mixed cystic and solid nodules >/= 1.5 cm not described
below (TR2): 0

_________________________________________________________

No discrete nodules are seen within the thyroid gland.
IMPRESSION: Negative.

The above is in keeping with the ACR TI-RADS recommendations - [HOSPITAL] 0508;[DATE].

## 2019-04-11 ENCOUNTER — Encounter (HOSPITAL_COMMUNITY): Payer: Self-pay | Admitting: Emergency Medicine

## 2019-04-11 ENCOUNTER — Emergency Department (HOSPITAL_COMMUNITY)
Admission: EM | Admit: 2019-04-11 | Discharge: 2019-04-12 | Disposition: A | Discharge status: Home / Self Care | Payer: BLUE CROSS/BLUE SHIELD | Attending: Emergency Medicine | Admitting: Emergency Medicine

## 2019-04-11 ENCOUNTER — Other Ambulatory Visit: Payer: Self-pay

## 2019-04-11 DIAGNOSIS — T7840XA Allergy, unspecified, initial encounter: Secondary | ICD-10-CM

## 2019-04-11 DIAGNOSIS — Z79899 Other long term (current) drug therapy: Secondary | ICD-10-CM | POA: Insufficient documentation

## 2019-04-11 DIAGNOSIS — I10 Essential (primary) hypertension: Secondary | ICD-10-CM | POA: Insufficient documentation

## 2019-04-11 DIAGNOSIS — L509 Urticaria, unspecified: Secondary | ICD-10-CM

## 2019-04-11 NOTE — ED Triage Notes (Addendum)
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 Pt denies having difficulty breathing, states he gave himself his epi pen around 5:30 because the rash was getting worse, he also took a claritin and benadryl. In no apparent distress at this time. The last time this happened he had to be admitted, was tested by an allergist.

## 2019-04-12 MED ORDER — EPINEPHRINE 0.3 MG/0.3ML IJ SOAJ: 0.3000 mg | Freq: Once | INTRAMUSCULAR | 1 refills | Status: DC | PRN

## 2019-04-12 MED ORDER — PREDNISONE 10 MG PO TABS: ORAL_TABLET | ORAL | 0 refills | Status: DC

## 2019-04-12 MED ORDER — PREDNISONE 20 MG PO TABS
60.0000 mg | ORAL_TABLET | Freq: Once | ORAL | Status: AC
  Administered 2019-04-12: 03:00:00 60 mg via ORAL
  Filled 2019-04-12: qty 3

## 2019-04-12 NOTE — Discharge Instructions (Signed)
Take prednisone as prescribed until finished. Follow up with your primary care doctor for recheck. You may return to the ED for new or concerning symptoms.

## 2019-04-12 NOTE — ED Provider Notes (Signed)
[facility] Provider Note   CSN: [id number] Arrival date & time: 04/11/19  2156     History   Chief Complaint Chief Complaint  Patient presents with  . Allergic Reaction    HPI Christian Hays is a 53 y.o. male.     53 year old male with a history of hypertension, esophageal reflux presents to the emergency department for an urticarial reaction.  Began to notice itching to his arms as well as his torso.  Noticed an urticarial rash shortly after.  This continued to spread and he used his EpiPen at 1700.  Subsequently took Benadryl and Claritin.  Feels that his rash has been slowly improving.  He denies any difficulty breathing, difficulty swallowing, lip or tongue swelling, nausea, abdominal pain, syncope or near syncope.  Presently, states that he feels at his baseline.  Followed up with an allergist when he had a similar reaction in 2018, but allergy testing was negative.  The history is provided by the patient. No language interpreter was used.  Allergic Reaction   Past Medical History:  Diagnosis Date  . GERD (gastroesophageal reflux disease)   . Hypertension   . Thyroid disease     Patient Active Problem List   Diagnosis Date Noted  . Urticaria 06/10/2017  . Elevated WBC count 06/10/2017  . GERD (gastroesophageal reflux disease) 06/10/2017    Past Surgical History:  Procedure Laterality Date  . APPENDECTOMY          Home Medications    Prior to Admission medications   Medication Sig Start Date End Date Taking? Authorizing Provider  omeprazole (PRILOSEC) 20 MG capsule Take 40 mg by mouth daily. 04/02/19  Yes [provider]  diphenhydrAMINE (BENADRYL) 50 MG capsule Take 1 capsule (50 mg total) by mouth 4 (four) times daily. Patient not taking: Reported on 04/12/2019 06/13/17   Joseph ArtVann, Jessica U, DO  diphenhydrAMINE-zinc acetate (BENADRYL) cream Apply topically 3 (three) times daily as needed for itching. Patient not  taking: Reported on 04/12/2019 06/13/17   Joseph ArtVann, Jessica U, DO  EPINEPHrine (EPIPEN 2-PAK) 0.3 mg/0.3 mL IJ SOAJ injection Inject 0.3 mLs (0.3 mg total) into the muscle once as needed for up to 1 dose (for severe allergic reaction). CAll 911 immediately if you have to use this medicine 04/12/19   Antony MaduraHumes, Coyt Govoni, PA-C  famotidine (PEPCID) 40 MG tablet Take 1 tablet (40 mg total) by mouth 2 (two) times daily. Patient not taking: Reported on 04/12/2019 06/13/17   Joseph ArtVann, Jessica U, DO  HYDROcodone-acetaminophen (NORCO/VICODIN) 5-325 MG tablet Take 1 tablet by mouth every 4 (four) hours as needed. Patient not taking: Reported on 04/12/2019 04/12/18   Jacalyn LefevreHaviland, Julie, MD  hydrOXYzine (ATARAX/VISTARIL) 50 MG tablet Take 1 tablet (50 mg total) by mouth 3 (three) times daily. Patient not taking: Reported on 04/12/2019 06/13/17   Joseph ArtVann, Jessica U, DO  loratadine (CLARITIN) 10 MG tablet Take 1 tablet (10 mg total) by mouth daily. Patient not taking: Reported on 04/12/2019 06/14/17   Joseph ArtVann, Jessica U, DO  ondansetron (ZOFRAN ODT) 4 MG disintegrating tablet Take 1 tablet (4 mg total) by mouth every 8 (eight) hours as needed. Patient not taking: Reported on 04/12/2019 04/12/18   Jacalyn LefevreHaviland, Julie, MD  predniSONE (DELTASONE) 10 MG tablet 40 mg PO x 2 days then 30 mg PO x 2 days then 20 mg PO x 2 days then 10mg  PO x 2 days then stop 04/12/19   Antony MaduraHumes, Lazarus Sudbury, PA-C    Family History No  family history on file.  Social History Social History   Tobacco Use  . Smoking status: Never Smoker  . Smokeless tobacco: Never Used  Substance Use Topics  . Alcohol use: No  . Drug use: No     Allergies   Patient has no known allergies.   Review of Systems Review of Systems Ten systems reviewed and are negative for acute change, except as noted in the HPI.    Physical Exam Updated Vital Signs BP (!) 116/95   Pulse (!) 49   Temp 97.7 F (36.5 C) (Oral)   Resp 16   SpO2 97%   Physical Exam Vitals signs and nursing note reviewed.   Constitutional:      General: He is not in acute distress.    Appearance: He is well-developed. He is not diaphoretic.     Comments: Nontoxic-appearing and in no acute distress  HENT:     Head: Normocephalic and atraumatic.     Mouth/Throat:     Comments: Tolerating secretions.  Normal phonation. Eyes:     General: No scleral icterus.    Conjunctiva/sclera: Conjunctivae normal.  Neck:     Musculoskeletal: Normal range of motion.  Pulmonary:     Effort: Pulmonary effort is normal. No respiratory distress.     Breath sounds: No stridor. No wheezing, rhonchi or rales.     Comments: Lungs clear to auscultation bilaterally.  Respirations even and unlabored. Musculoskeletal: Normal range of motion.  Skin:    General: Skin is warm and dry.     Coloration: Skin is not pale.     Comments: Faint, residual urticaria noted to arms and trunk.  Neurological:     Mental Status: He is alert and oriented to person, place, and time.  Psychiatric:        Behavior: Behavior normal.      ED Treatments / Results  Labs (all labs ordered are listed, but only abnormal results are displayed) Labs Reviewed - No data to display  EKG None  Radiology No results found.  Procedures Procedures (including critical care time)  Medications Ordered in ED Medications  predniSONE (DELTASONE) tablet 60 mg (60 mg Oral Given 04/12/19 0307)     Initial Impression / Assessment and Plan / ED Course  I have reviewed the triage vital signs and the nursing notes.  Pertinent labs & imaging results that were available during my care of the patient were reviewed by me and considered in my medical decision making (see chart for details).        53 year old male presenting for urticarial reaction which has been improving after self administering an EpiPen at home at 1700.  He subsequently took Benadryl and Claritin.  Unclear inciting factors.  Vitals have been stable without hypoxia.  Lungs clear.  No  angioedema.  Patient states that he feels at baseline.  He does not require further observation as his EpiPen was administered greater than 10 hours ago.  He has been started on prednisone here.  Will discharge with a taper of prednisone as well as instruction to follow-up with his primary care doctor.  Return precautions discussed and provided. Patient discharged in stable condition with no unaddressed concerns.   Final Clinical Impressions(s) / ED Diagnoses   Final diagnoses:  Allergic reaction, initial encounter  Urticaria    ED Discharge Orders         Ordered    predniSONE (DELTASONE) 10 MG tablet     04/12/19 16100307  EPINEPHrine (EPIPEN 2-PAK) 0.3 mg/0.3 mL IJ SOAJ injection  Once PRN     04/12/19 0307           Antonietta Breach, PA-C 16/10/96 0454    Delora Fuel, MD 09/81/19 [phone]

## 2019-04-29 IMAGING — CT CT ANGIO CHEST
2 of 11 series · 15 of 36 positions shown · IV contrast (OMNI)
Comparison: None.

CLINICAL DATA: Body ache.  Positive D-dimer.

EXAM:
CT ANGIOGRAPHY CHEST WITH CONTRAST
TECHNIQUE: Multidetector CT imaging of the chest was performed using the
standard protocol during bolus administration of intravenous
contrast. Multiplanar CT image reconstructions and MIPs were
obtained to evaluate the vascular anatomy.
CONTRAST:  80 cc Isovue 370

[Series 9: thins · axial · 0.69mm/px · z∈[+1252,+1484]mm · 14 of 268 slices shown]
[im 18/268  lung]
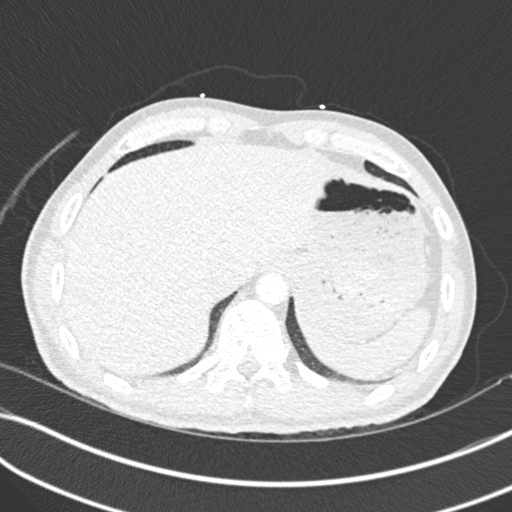
[im 36/268  mediastinal]
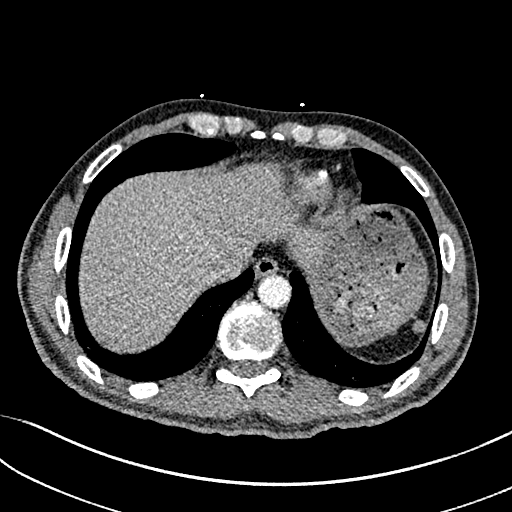
[im 54/268  lung]
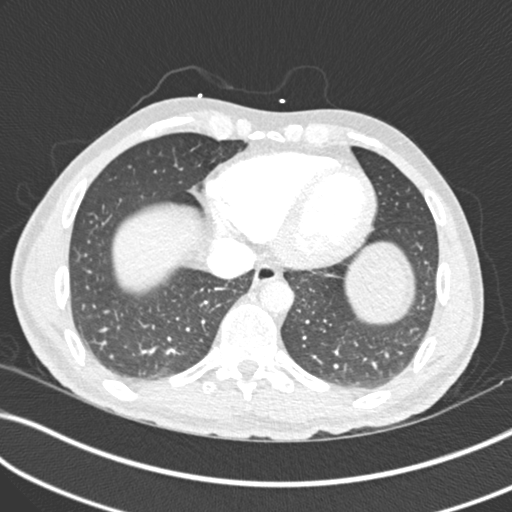
[im 72/268  mediastinal]
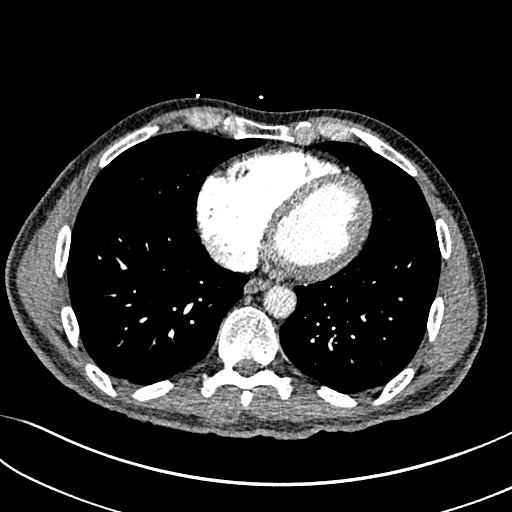
[im 90/268  lung]
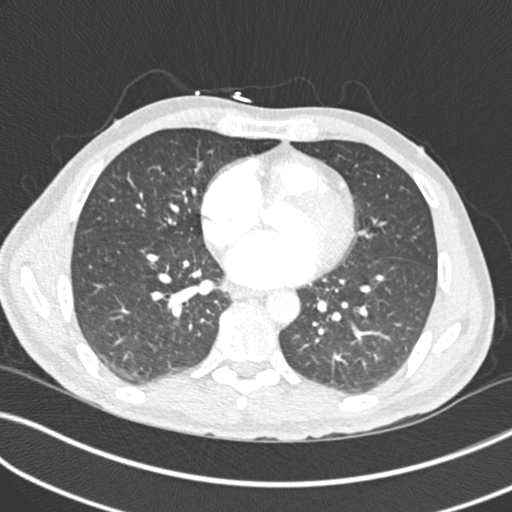
[im 107/268  mediastinal]
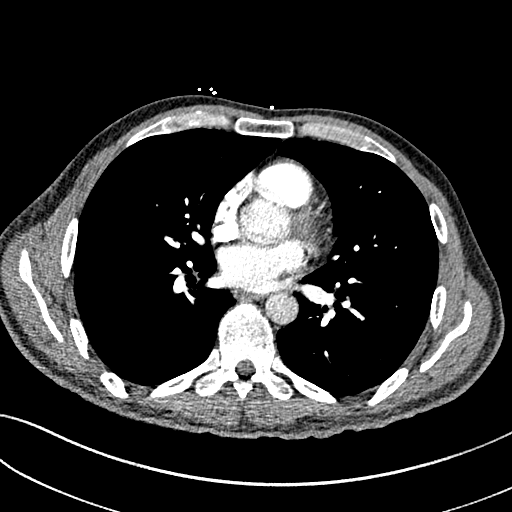
[im 125/268  lung]
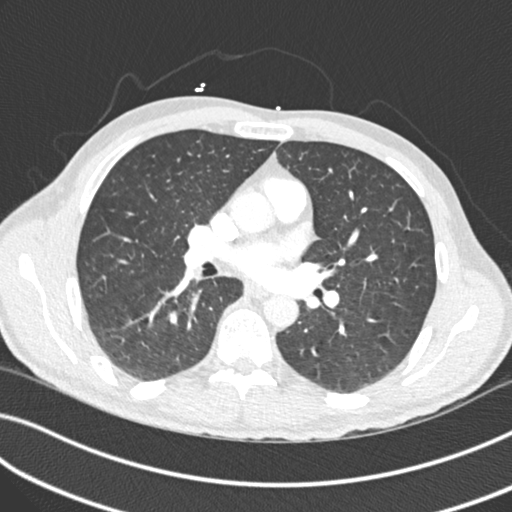
[im 143/268  mediastinal]
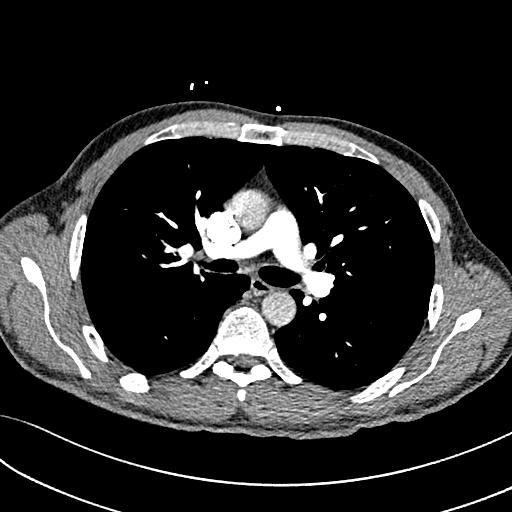
[im 161/268  lung]
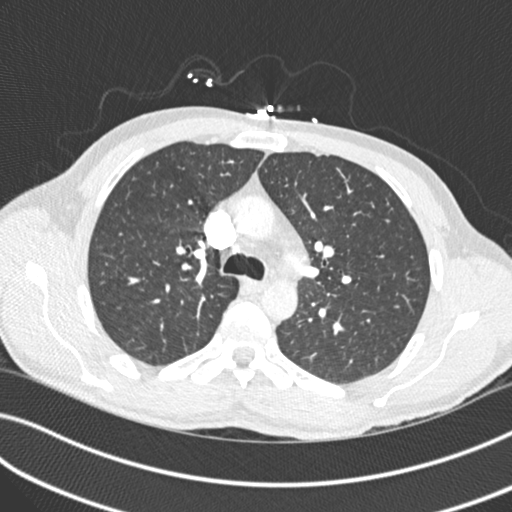
[im 179/268  mediastinal]
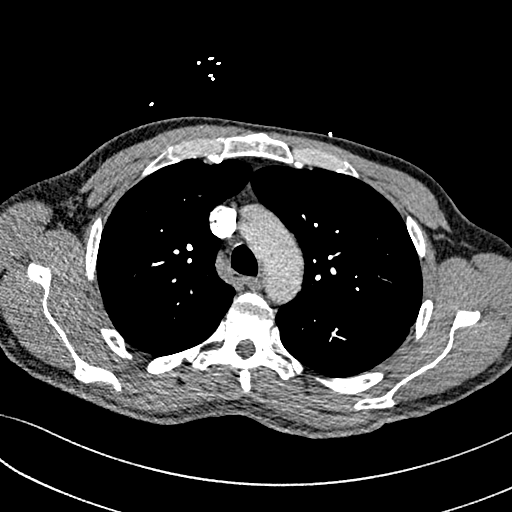
[im 196/268  lung]
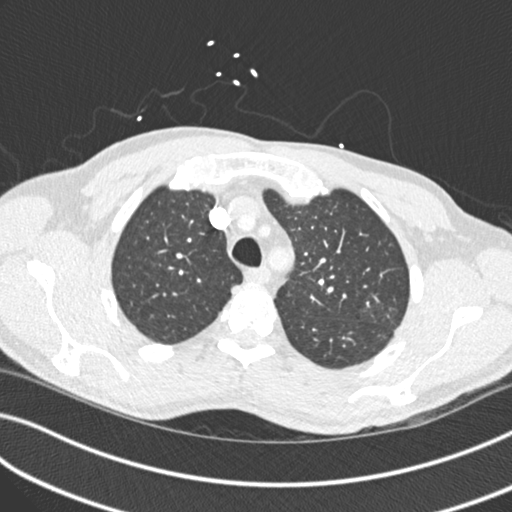
[im 214/268  mediastinal]
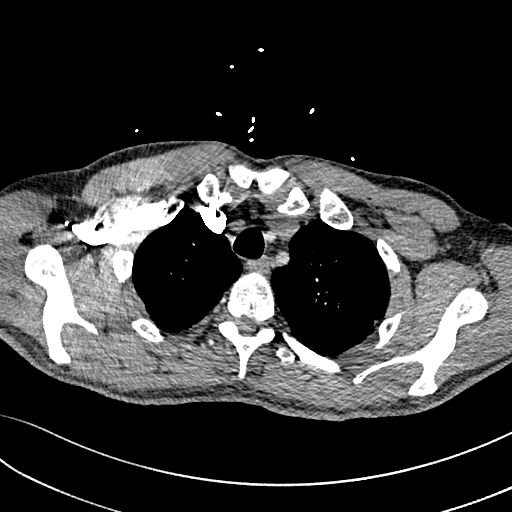
[im 232/268  lung]
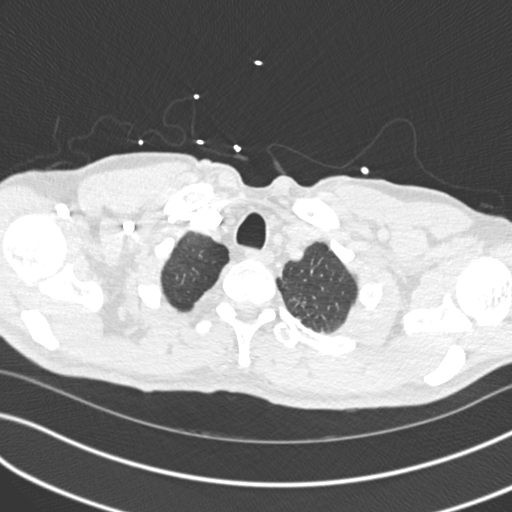
[im 250/268  mediastinal]
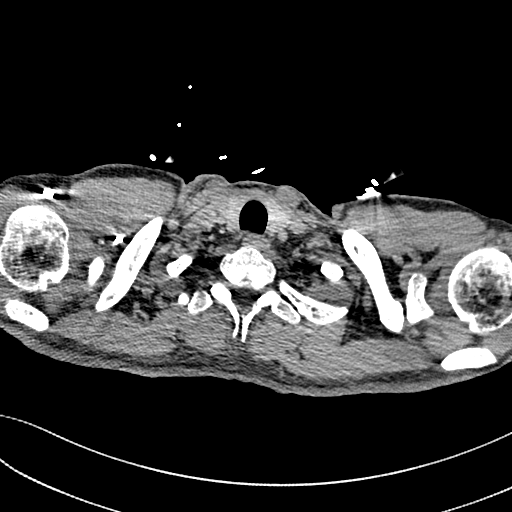

[Series 11: coronal mpr · coronal · 0.66mm/px · 1 of 151 slices shown]
[im 76/151  mediastinal]
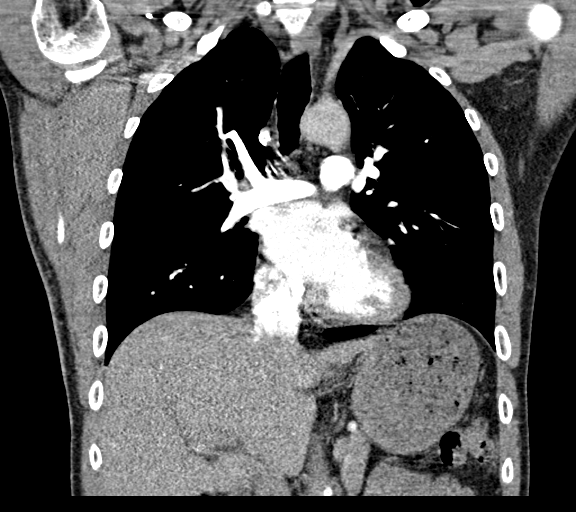

[15 of 36 positions shown; findings below may reference images not displayed]

FINDINGS: Cardiovascular: There are no filling defects in the pulmonary
arterial tree. Mild LAD territory coronary artery calcification. The
right ventricle and atrium are mildly dilated.

Mediastinum/Nodes: No abnormal mediastinal mass effect or
adenopathy.

Lungs/Pleura: Dependent atelectasis. Patchy density in the medial
right middle lobe is again most consistent with subsegmental
atelectasis. No pneumothorax. No pleural effusion.

Upper Abdomen: No acute abnormality.

Musculoskeletal: No acute bony deformity.

Review of the MIP images confirms the above findings.
IMPRESSION: No evidence of pulmonary embolism.

Dilatation of the right atrium and right ventricle is of unknown
significance but may simply represent volume overload. Correlate
clinically as for the need for further imaging.

## 2019-09-11 HISTORY — PX: COLONOSCOPY: SHX174

## 2019-09-11 HISTORY — PX: ESOPHAGOGASTRODUODENOSCOPY: SHX1529

## 2019-11-19 ENCOUNTER — Ambulatory Visit: Payer: PRIVATE HEALTH INSURANCE | Attending: Internal Medicine

## 2019-11-19 DIAGNOSIS — Z23 Encounter for immunization: Secondary | ICD-10-CM

## 2019-11-19 NOTE — Progress Notes (Signed)
   Covid-19 Vaccination Clinic  Name:  Christian Hays    MRN: [id number] DOB: 1966/06/15  11/19/2019  [name] was observed post Covid-19 immunization for 15 minutes without incident. He was provided with Vaccine Information Sheet and instruction to access the V-Safe system.   [name] was instructed to call 911 with any severe reactions post vaccine: Marland Kitchen Difficulty breathing  . Swelling of face and throat  . A fast heartbeat  . A bad rash all over body  . Dizziness and weakness   Immunizations Administered    Name Date Dose VIS Date Route   Pfizer COVID-19 Vaccine 11/19/2019  8:04 AM 0.3 mL 08/14/2019 Intramuscular   Manufacturer: [employer], [employer]   Lot: [id number]   NDC: [address]-3

## 2019-12-14 ENCOUNTER — Ambulatory Visit: Payer: PRIVATE HEALTH INSURANCE | Attending: Internal Medicine

## 2019-12-14 DIAGNOSIS — Z23 Encounter for immunization: Secondary | ICD-10-CM

## 2019-12-14 NOTE — Progress Notes (Signed)
   Covid-19 Vaccination Clinic  Name:  Osa Campoli    MRN: [id number] DOB: 1965/12/08  12/14/2019  Mr. Brallier was observed post Covid-19 immunization for 15 minutes without incident. He was provided with Vaccine Information Sheet and instruction to access the V-Safe system.   Mr. Sainato was instructed to call 911 with any severe reactions post vaccine: Marland Kitchen Difficulty breathing  . Swelling of face and throat  . A fast heartbeat  . A bad rash all over body  . Dizziness and weakness   Immunizations Administered    Name Date Dose VIS Date Route   Pfizer COVID-19 Vaccine 12/14/2019  8:07 AM 0.3 mL 08/14/2019 Intramuscular   Manufacturer: [employer], [employer]   Lot: [id number]   NDC: [address]-3

## 2020-01-27 IMAGING — US US ABDOMEN LIMITED
1 series · 14 of 25 positions shown · non-contrast
Comparison: None.

CLINICAL DATA: Epigastric pain and nausea for 4 days.

EXAM:
ULTRASOUND ABDOMEN LIMITED RIGHT UPPER QUADRANT

[Series 1: us abdomen limited · 0.17mm/px · 14 of 31 slices shown]
[im 1/31]
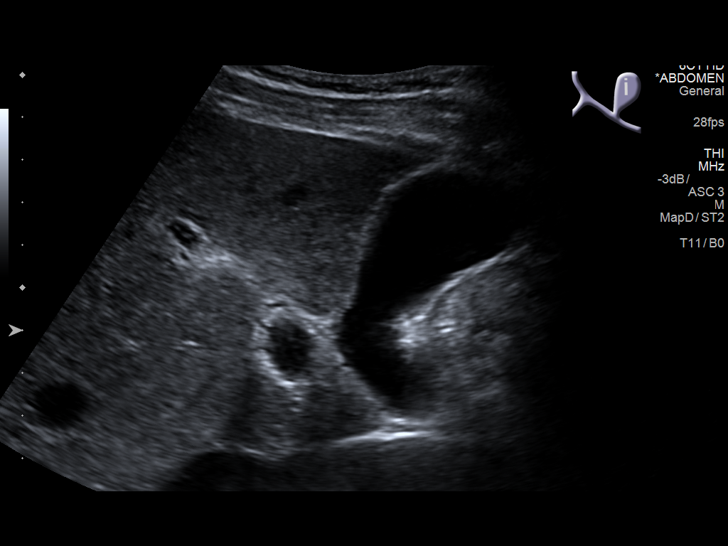
[im 3/31]
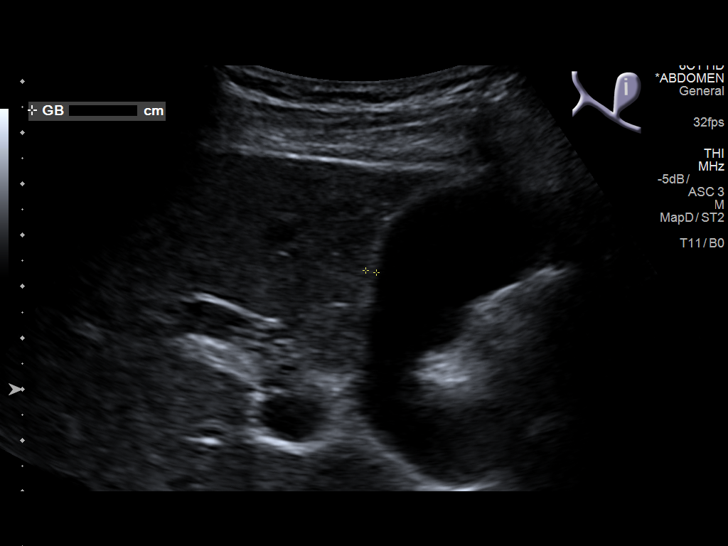
[im 6/31]
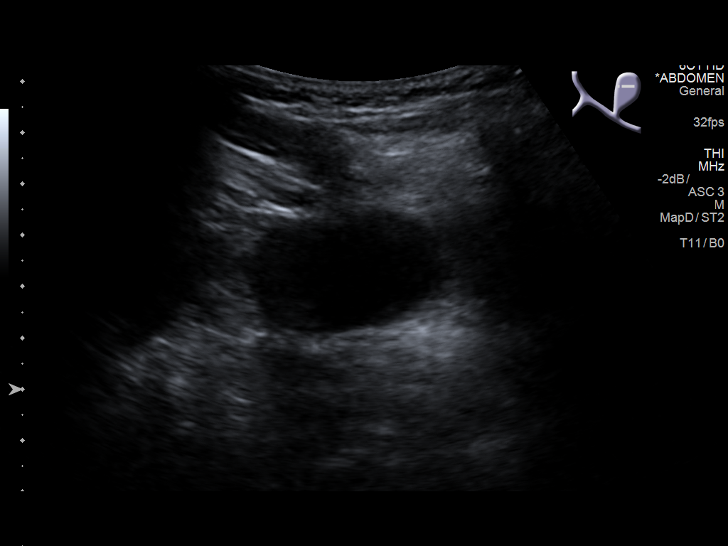
[im 8/31]
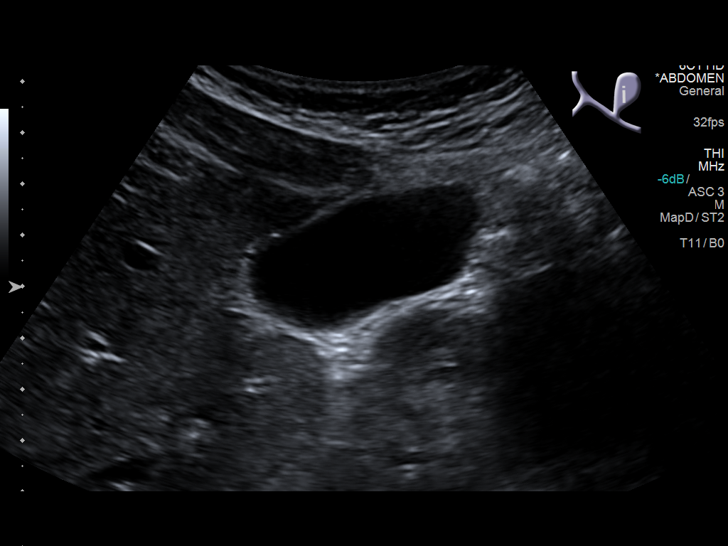
[im 11/31]
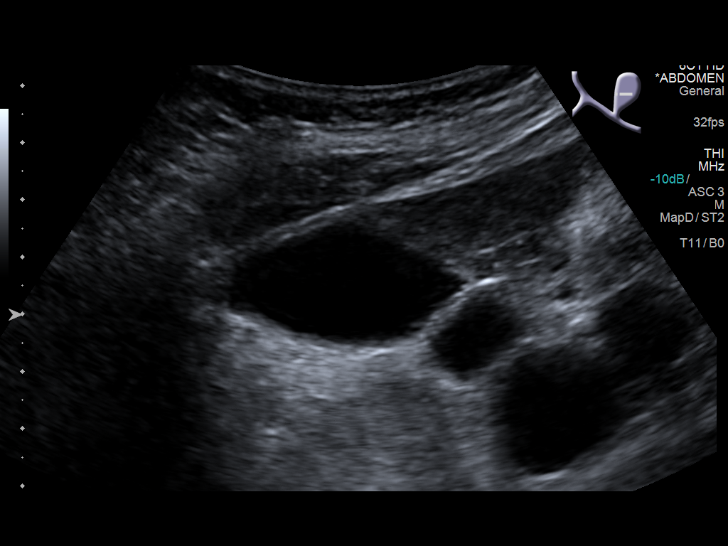
[im 12/31]
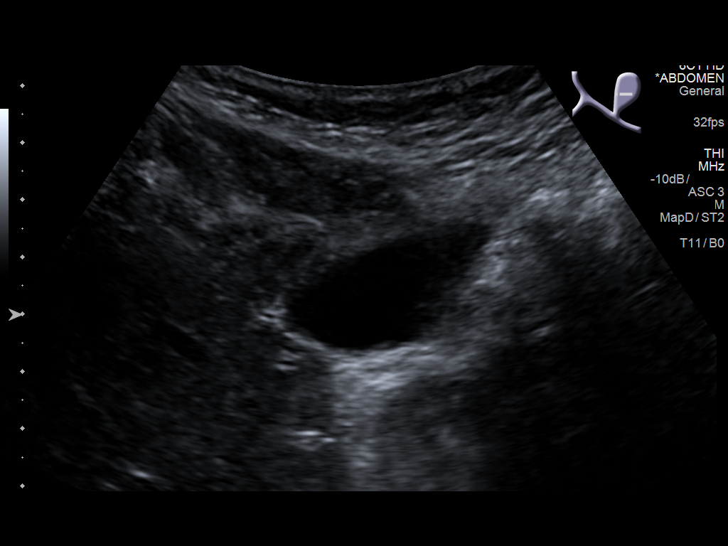
[im 14/31]
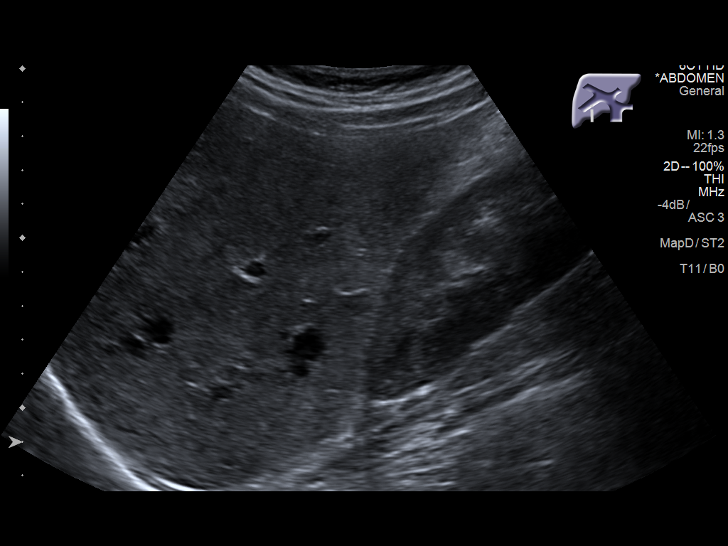
[im 17/31]
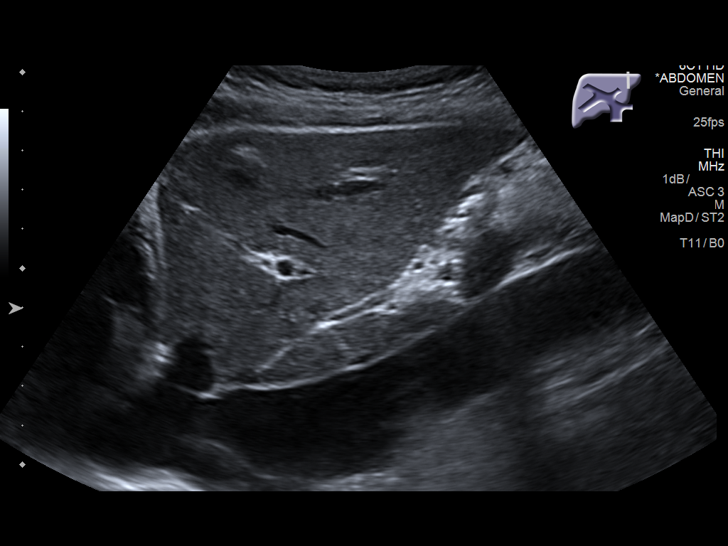
[im 19/31]
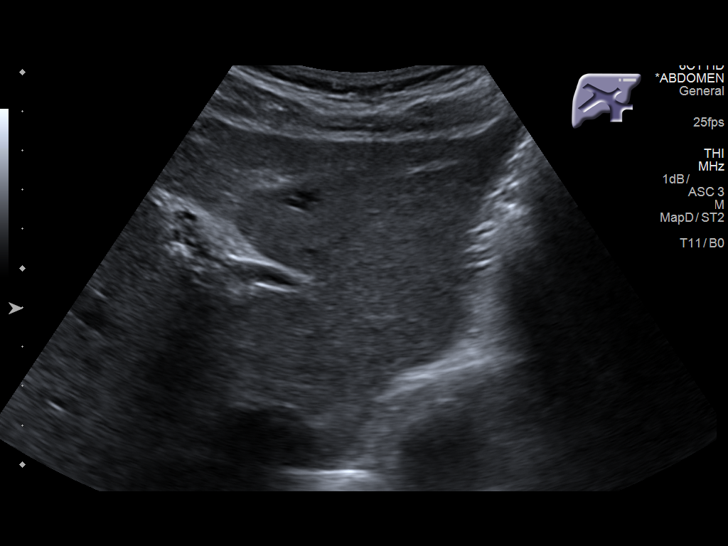
[im 21/31]
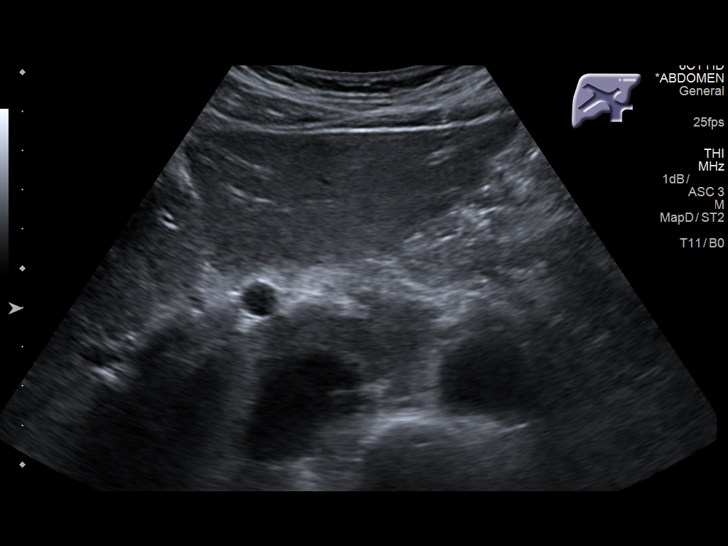
[im 23/31]
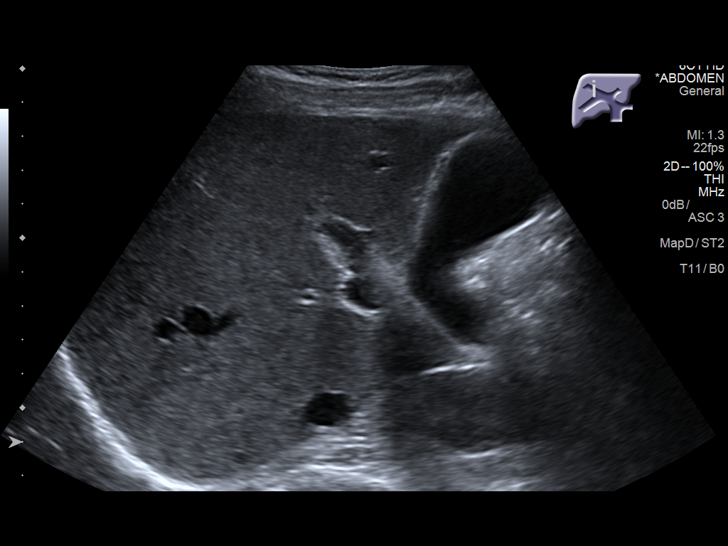
[im 26/31]
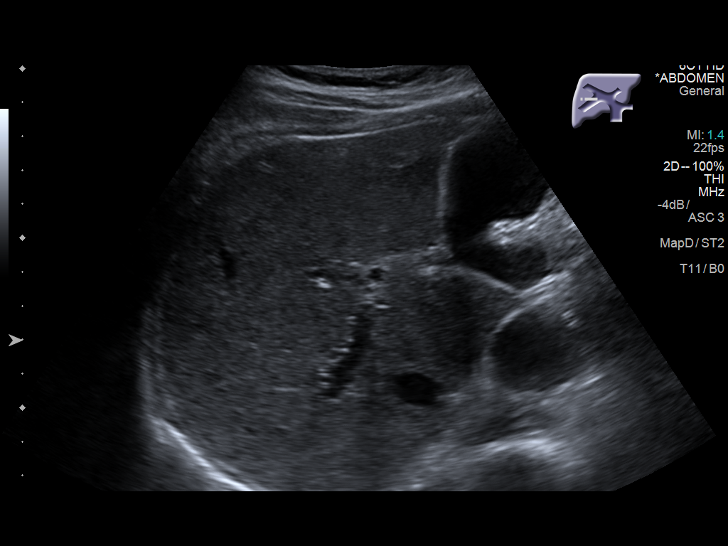
[im 28/31]
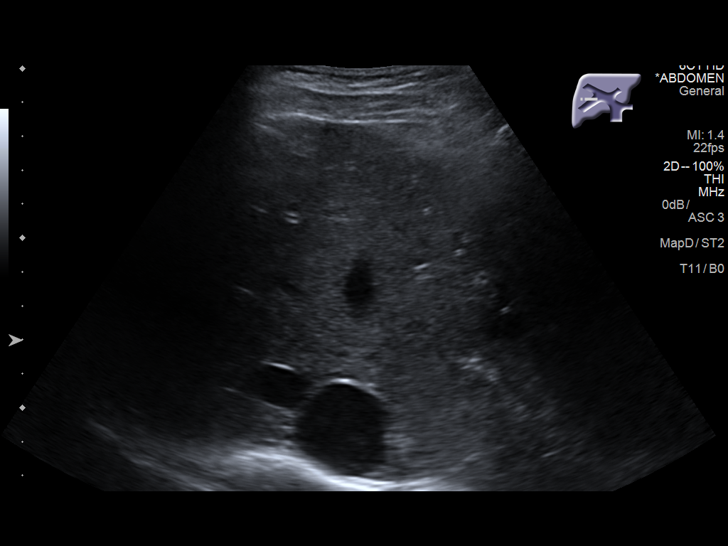
[im 31/31]
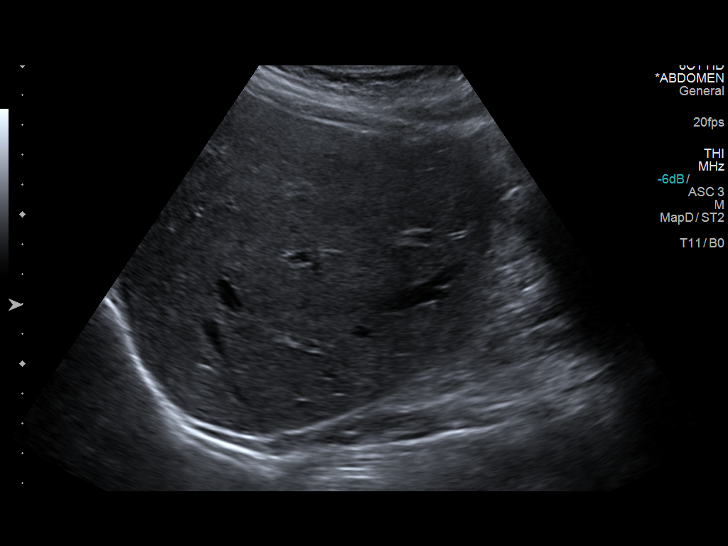

[14 of 25 positions shown; findings below may reference images not displayed]

FINDINGS: Gallbladder:

No gallstones or wall thickening visualized. No sonographic Murphy
sign noted by sonographer.

Common bile duct:

Diameter: 2 mm

Liver:

No focal lesion identified. Within normal limits in parenchymal
echogenicity. Portal vein is patent on color Doppler imaging with
normal direction of blood flow towards the liver.
IMPRESSION: Normal right upper quadrant ultrasound.

## 2020-06-07 ENCOUNTER — Ambulatory Visit: Payer: PRIVATE HEALTH INSURANCE | Attending: Internal Medicine

## 2020-06-07 DIAGNOSIS — Z23 Encounter for immunization: Secondary | ICD-10-CM

## 2020-06-07 NOTE — Progress Notes (Signed)
   Covid-19 Vaccination Clinic  Name:  Christian Hays    MRN: [id number] DOB: 12-Apr-1966  06/07/2020  [name] was observed post Covid-19 immunization for 15 minutes without incident. He was provided with Vaccine Information Sheet and instruction to access the V-Safe system.   [name] was instructed to call 911 with any severe reactions post vaccine: Marland Kitchen Difficulty breathing  . Swelling of face and throat  . A fast heartbeat  . A bad rash all over body  . Dizziness and weakness

## 2021-10-24 ENCOUNTER — Ambulatory Visit (INDEPENDENT_AMBULATORY_CARE_PROVIDER_SITE_OTHER): Payer: 59 | Admitting: Family Medicine

## 2021-10-24 ENCOUNTER — Encounter: Payer: Self-pay | Admitting: Family Medicine

## 2021-10-24 VITALS — BP 99/64 | HR 67 | Ht 66.0 in | Wt 157.2 lb

## 2021-10-24 DIAGNOSIS — K649 Unspecified hemorrhoids: Secondary | ICD-10-CM

## 2021-10-24 DIAGNOSIS — J3489 Other specified disorders of nose and nasal sinuses: Secondary | ICD-10-CM

## 2021-10-24 DIAGNOSIS — K219 Gastro-esophageal reflux disease without esophagitis: Secondary | ICD-10-CM | POA: Diagnosis not present

## 2021-10-24 MED ORDER — PANTOPRAZOLE SODIUM 40 MG PO TBEC: 40.0000 mg | DELAYED_RELEASE_TABLET | Freq: Every day | ORAL | 1 refills | Status: AC

## 2021-10-24 MED ORDER — AYR SALINE NASAL NA GEL: 1.0000 "application " | Freq: Four times a day (QID) | NASAL | 0 refills | Status: AC | PRN

## 2021-10-24 NOTE — Patient Instructions (Signed)
Thank you for choosing [facility] for your Primary Care needs. I am excited for the opportunity to partner with you to meet your health care goals. It was a pleasure meeting you today! ° ° °Information on diet, exercise, and health maintenance recommendations are listed below. This is information to help you be sure you are on track for optimal health and monitoring.  ° °Please look over this and let us know if you have any questions or if you have completed any of the health maintenance outside of [facility] so that we can be sure your records are up to date.  °___________________________________________________________ ° °MyChart:  °For all urgent or time sensitive needs we ask that you please call the office to avoid delays. Our number is [phone]. °MyChart is not constantly monitored and due to the large volume of messages a day, replies may take up to 72 business hours. ° °MyChart Policy: °MyChart allows for you to see your visit notes, after visit summary, provider recommendations, lab and tests results, make an appointment, request refills, and contact your provider or the office for non-urgent questions or concerns. Providers are seeing patients during normal business hours and do not have built in time to review MyChart messages.  °We ask that you allow a minimum of 3 business days for responses to MyChart messages. For this reason, please do not send urgent requests through MyChart. Please call the office at [phone]. °New and ongoing conditions may require a visit. We have virtual and in-person visits available for your convenience.  °Complex MyChart concerns may require a visit. Your provider may request you schedule a virtual or in-person visit to ensure we are providing the best care possible. °MyChart messages sent after 11:00 AM on Friday will not be received by the provider until Monday morning.  °  °Lab and Test Results: °You will receive your lab and  test results on MyChart as soon as they are completed and results have been sent by the lab or testing facility. Due to this service, you will receive your results BEFORE your provider.  °I review lab and test results each morning prior to seeing patients. Some results require collaboration with other providers to ensure you are receiving the most appropriate care. For this reason, we ask that you please allow a minimum of 3-5 business days from the time that ALL results have been received for your provider to receive and review lab and test results and contact you about these.  °Most lab and test result comments from the provider will be sent through MyChart. Your provider may recommend changes to the plan of care, follow-up visits, repeat testing, ask questions, or request an office visit to discuss these results. You may reply directly to this message or call the office to provide information for the provider or set up an appointment. °In some instances, you will be called with test results and recommendations. Please let us know if this is preferred and we will make note of this in your chart to provide this for you.    °If you have not heard a response to your lab or test results in 5 business days from all results returning to MyChart, please call the office to let us know. We ask that you please avoid calling prior to this time unless there is an emergent concern. Due to high call volumes, this can delay the resulting process. ° °After Hours: °For all non-emergency after hours needs,   please call the office at [phone] and select the option to reach the on-call  service. On-call services are shared between multiple [place] offices and therefore it will not be possible to speak directly with your provider. On-call providers may provide medical advice and recommendations, but are unable to provide refills for maintenance medications.  °For all emergency or urgent medical needs after normal business  hours, we recommend that you seek care at the closest Urgent Care or Emergency Department to ensure appropriate treatment in a timely manner.  °[facility] has a 24 hour emergency room located on the ground floor for your convenience.  ° °Urgent Concerns During the Business Day °Providers are seeing patients from 8AM to 5PM with a busy schedule and are most often not able to respond to non-urgent calls until the end of the day or the next business day. °If you should have URGENT concerns during the day, please call and speak to the nurse or schedule a same day appointment so that we can address your concern without delay.  ° °Thank you, again, for choosing me as your health care partner. I appreciate your trust and look forward to learning more about you.  ° °Marijo Quizon B. Hebert Dooling, DNP, FNP-C ° °___________________________________________________________ ° °Health Maintenance Recommendations °Screening Testing °Mammogram °Every 1-2 years based on history and risk factors °Starting at age 50 °Pap Smear °Ages 21-39 every 3 years °Ages 30-65 every 5 years with HPV testing °More frequent testing may be required based on results and history °Colon Cancer Screening °Every 1-10 years based on test performed, risk factors, and history °Starting at age 45 °Bone Density Screening °Every 2-10 years based on history °Starting at age 65 for women °Recommendations for men differ based on medication usage, history, and risk factors °AAA Screening °One time ultrasound °Men 65-75 years old who have ever smoked °Lung Cancer Screening °Low Dose Lung CT every 12 months °Age 50-80 years with a 20 pack-year smoking history who still smoke or who have quit within the last 15 years ° °Screening Labs °Routine  Labs: Complete Blood Count (CBC), Complete Metabolic Panel (CMP), Cholesterol (Lipid Panel) °Every 6-12 months based on history and medications °May be recommended more frequently based on current conditions or  previous results °Hemoglobin A1c Lab °Every 3-12 months based on history and previous results °Starting at age 45 or earlier with diagnosis of diabetes, high cholesterol, BMI >26, and/or risk factors °Frequent monitoring for patients with diabetes to ensure blood sugar control °Thyroid Panel (TSH w/ T3 & T4) °Every 6 months based on history, symptoms, and risk factors °May be repeated more often if on medication °HIV °One time testing for all patients 13 and older °May be repeated more frequently for patients with increased risk factors or exposure °Hepatitis C °One time testing for all patients 18 and older °May be repeated more frequently for patients with increased risk factors or exposure °Gonorrhea, Chlamydia °Every 12 months for all sexually active persons 13-24 years °Additional monitoring may be recommended for those who are considered high risk or who have symptoms °PSA °Men 40-54 years old with risk factors °Additional screening may be recommended from age 55-69 based on risk factors, symptoms, and history ° °Vaccine Recommendations °Tetanus Booster °All adults every 10 years °Flu Vaccine °All patients 6 months and older every year °COVID Vaccine °All patients 12 years and older °Initial dosing with booster °May recommend additional booster based on age and health history °HPV Vaccine °2 doses all patients   age 9-26 °Dosing may be considered for patients over 26 °Shingles Vaccine (Shingrix) °2 doses all adults 50 years and older °Pneumonia (Pneumovax 23) °All adults 65 years and older °May recommend earlier dosing based on health history °Pneumonia (Prevnar 13) °All adults 65 years and older °Dosed 1 year after Pneumovax 23 °Pneumonia (Prevnar 20) °All adults 65 years and older (adults 19-64 with certain conditions or risk factors) °1 dose  °For those who have no received Prevnar 13 vaccine previously ° ° °Additional Screening, Testing, and Vaccinations may be recommended on an individualized basis based on  family history, health history, risk factors, and/or exposure.  °__________________________________________________________ ° °Diet Recommendations for All Patients ° °I recommend that all patients maintain a diet low in saturated fats, carbohydrates, and cholesterol. While this can be challenging at first, it is not impossible and small changes can make big differences.  °Things to try: °Decreasing the amount of soda, sweet tea, and/or juice to one or less per day and replace with water °While water is always the first choice, if you do not like water you may consider °adding a water additive without sugar to improve the taste °other sugar free drinks °Replace potatoes with a brightly colored vegetable at dinner °Use healthy oils, such as canola oil or olive oil, instead of butter or hard margarine °Limit your bread intake to two pieces or less a day °Replace regular pasta with low carb pasta options °Bake, broil, or grill foods instead of frying °Monitor portion sizes  °Eat smaller, more frequent meals throughout the day instead of large meals ° °An important thing to remember is, if you love foods that are not great for your health, you don't have to give them up completely. Instead, allow these foods to be a reward when you have done well. Allowing yourself to still have special treats every once in a while is a nice way to tell yourself thank you for working hard to keep yourself healthy.  ° °Also remember that every day is a new day. If you have a bad day and "fall off the wagon", you can still climb right back up and keep moving along on your journey! ° °We have resources available to help you!  °Some websites that may be helpful include: °www.MyPlate.gov  °Www.VeryWellFit.com °_____________________________________________________________ ° °Activity Recommendations for All Patients ° °I recommend that all adults get at least 20 minutes of moderate physical activity that elevates your heart rate at least 5  days out of the week.  °Some examples include: °Walking or jogging at a pace that allows you to carry on a conversation °Cycling (stationary bike or outdoors) °Water aerobics °Yoga °Weight lifting °Dancing °If physical limitations prevent you from putting stress on your joints, exercise in a pool or seated in a chair are excellent options. ° °Do determine your MAXIMUM heart rate for activity: YOUR AGE - 220 = MAX HeartRate  ° °Remember! °Do not push yourself too hard.  °Start slowly and build up your pace, speed, weight, time in exercise, etc.  °Allow your body to rest between exercise and get good sleep. °You will need more water than normal when you are exerting yourself. Do not wait until you are thirsty to drink. Drink with a purpose of getting in at least 8, 8 ounce glasses of water a day plus more depending on how much you exercise and sweat.  ° ° °If you begin to develop dizziness, chest pain, abdominal pain, jaw pain, shortness of breath, headache, vision   changes, lightheadedness, or other concerning symptoms, stop the activity and allow your body to rest. If your symptoms are severe, seek emergency evaluation immediately. If your symptoms are concerning, but not severe, please let us know so that we can recommend further evaluation.  ° ° ° °

## 2021-10-24 NOTE — Progress Notes (Signed)
______________________________________________________________________  HPI Christian Hays is a 56 y.o. male presenting to [facility] today to establish care.   Patient Care Team: Clayborne DanaBeck, Clevland Cork B, NP as PCP - General (Family Medicine)  Health Maintenance  Topic Date Due   Hepatitis C Screening: USPSTF Recommendation to screen - Ages 5018-79 yo.  Never done   Colon Cancer Screening  Never done   Zoster (Shingles) Vaccine (1 of 2) Never done   Tetanus Vaccine  04/01/2029   Flu Shot  Completed   COVID-19 Vaccine  Completed   HIV Screening  Completed   HPV Vaccine  Aged Out     Concerns today: Nasal irritation/dryness - wakes up and has a lot of drainage and nasal dryness, he was getting good with relief with spray from [employer]  ([place]); occasional scant dried blood; no allergies or difficulty breathing  Hemorrhoids - 3 weeks of pain with wiping, not currently bleeding, not constipated, OTC hemorrhoid cream not helping; 3 years ago had rubber band procedure because they were bleeding at the time.  GERD/ulcer - states he was told he had an ulcer a few years ago, unsure of where, he has been doing well with protonix    Patient Active Problem List   Diagnosis Date Noted   Urticaria 06/10/2017   Elevated WBC count 06/10/2017   GERD (gastroesophageal reflux disease) 06/10/2017    PHQ9 Today: No flowsheet data found. GAD7 Today: No flowsheet data found. ______________________________________________________________________ PMH Past Medical History:  Diagnosis Date   GERD (gastroesophageal reflux disease)    Hypertension    Thyroid disease     ROS All review of systems negative except what is listed in the HPI  PHYSICAL EXAM Physical Exam Vitals reviewed.  Constitutional:      Appearance: Normal appearance. He is normal weight.  HENT:     Nose: Nose normal. No congestion or rhinorrhea.     Mouth/Throat:     Mouth: Mucous membranes  are moist.     Pharynx: Oropharynx is clear.  Cardiovascular:     Rate and Rhythm: Normal rate and regular rhythm.     Pulses: Normal pulses.     Heart sounds: Normal heart sounds.  Pulmonary:     Effort: Pulmonary effort is normal.     Breath sounds: Normal breath sounds.  Skin:    General: Skin is warm and dry.  Neurological:     Mental Status: He is alert.  Psychiatric:        Mood and Affect: Mood normal.        Behavior: Behavior normal.        Thought Content: Thought content normal.        Judgment: Judgment normal.   ______________________________________________________________________ ASSESSMENT AND PLAN  1. Hemorrhoids, unspecified hemorrhoid type Declined assessment today. GI referral placed.  - Ambulatory referral to Gastroenterology  2. Gastroesophageal reflux disease, unspecified whether esophagitis present Well managed. Refilled protonix. Continue to avoid triggers.  - pantoprazole (PROTONIX) 40 MG tablet; Take 1 tablet (40 mg total) by mouth daily.  Dispense: 90 tablet; Refill: 1  3. Nasal dryness Trying saline gel. Recommend humidifier and PRN saline nasal spray. Patient aware of signs/symptoms requiring further/urgent evaluation.  - saline (AYR) GEL; Place 1 application into both nostrils every 6 (six) hours as needed.  Dispense: 14.1 g; Refill: 0   Establish care  Education provided today during visit and on AVS for patient to review at home.  Diet and Exercise recommendations  provided.  Current diagnoses and recommendations discussed. HM recommendations reviewed with recommendations.    Outpatient Encounter Medications as of 10/24/2021  Medication Sig   saline (AYR) GEL Place 1 application into both nostrils every 6 (six) hours as needed.   [DISCONTINUED] pantoprazole (PROTONIX) 40 MG tablet Take by mouth.   pantoprazole (PROTONIX) 40 MG tablet Take 1 tablet (40 mg total) by mouth daily.   [DISCONTINUED] diphenhydrAMINE (BENADRYL) 50 MG capsule Take  1 capsule (50 mg total) by mouth 4 (four) times daily. (Patient not taking: Reported on 04/12/2019)   [DISCONTINUED] diphenhydrAMINE-zinc acetate (BENADRYL) cream Apply topically 3 (three) times daily as needed for itching. (Patient not taking: Reported on 04/12/2019)   [DISCONTINUED] EPINEPHrine (EPIPEN 2-PAK) 0.3 mg/0.3 mL IJ SOAJ injection Inject 0.3 mLs (0.3 mg total) into the muscle once as needed for up to 1 dose (for severe allergic reaction). CAll 911 immediately if you have to use this medicine   [DISCONTINUED] famotidine (PEPCID) 40 MG tablet Take 1 tablet (40 mg total) by mouth 2 (two) times daily. (Patient not taking: Reported on 04/12/2019)   [DISCONTINUED] HYDROcodone-acetaminophen (NORCO/VICODIN) 5-325 MG tablet Take 1 tablet by mouth every 4 (four) hours as needed. (Patient not taking: Reported on 04/12/2019)   [DISCONTINUED] hydrOXYzine (ATARAX/VISTARIL) 50 MG tablet Take 1 tablet (50 mg total) by mouth 3 (three) times daily. (Patient not taking: Reported on 04/12/2019)   [DISCONTINUED] loratadine (CLARITIN) 10 MG tablet Take 1 tablet (10 mg total) by mouth daily. (Patient not taking: Reported on 04/12/2019)   [DISCONTINUED] omeprazole (PRILOSEC) 20 MG capsule Take 40 mg by mouth daily.   [DISCONTINUED] ondansetron (ZOFRAN ODT) 4 MG disintegrating tablet Take 1 tablet (4 mg total) by mouth every 8 (eight) hours as needed. (Patient not taking: Reported on 04/12/2019)   [DISCONTINUED] predniSONE (DELTASONE) 10 MG tablet 40 mg PO x 2 days then 30 mg PO x 2 days then 20 mg PO x 2 days then 10mg  PO x 2 days then stop   No facility-administered encounter medications on file as of 10/24/2021.    Return in about 4 months (around 02/21/2022) for physical w/ labs .    Purcell Nails Olevia Bowens, DNP, FNP-C

## 2021-10-30 ENCOUNTER — Encounter: Payer: Self-pay | Admitting: Gastroenterology

## 2021-11-21 ENCOUNTER — Other Ambulatory Visit: Payer: Self-pay

## 2021-11-21 ENCOUNTER — Encounter: Payer: Self-pay | Admitting: Gastroenterology

## 2021-11-21 ENCOUNTER — Ambulatory Visit (INDEPENDENT_AMBULATORY_CARE_PROVIDER_SITE_OTHER): Payer: 59 | Admitting: Gastroenterology

## 2021-11-21 VITALS — BP 110/74 | HR 61 | Ht 72.0 in | Wt 161.0 lb

## 2021-11-21 DIAGNOSIS — Z8711 Personal history of peptic ulcer disease: Secondary | ICD-10-CM

## 2021-11-21 DIAGNOSIS — R1013 Epigastric pain: Secondary | ICD-10-CM

## 2021-11-21 DIAGNOSIS — K219 Gastro-esophageal reflux disease without esophagitis: Secondary | ICD-10-CM

## 2021-11-21 DIAGNOSIS — K5904 Chronic idiopathic constipation: Secondary | ICD-10-CM

## 2021-11-21 DIAGNOSIS — Z860100 Personal history of colon polyps, unspecified: Secondary | ICD-10-CM

## 2021-11-21 DIAGNOSIS — R195 Other fecal abnormalities: Secondary | ICD-10-CM

## 2021-11-21 DIAGNOSIS — K921 Melena: Secondary | ICD-10-CM

## 2021-11-21 DIAGNOSIS — K602 Anal fissure, unspecified: Secondary | ICD-10-CM

## 2021-11-21 MED ORDER — AMBULATORY NON FORMULARY MEDICATION: 2 refills | Status: DC

## 2021-11-21 MED ORDER — LUBIPROSTONE 24 MCG PO CAPS: 24.0000 ug | ORAL_CAPSULE | Freq: Two times a day (BID) | ORAL | 2 refills | Status: DC

## 2021-11-21 MED ORDER — SUCRALFATE 1 G PO TABS: 1.0000 g | ORAL_TABLET | Freq: Four times a day (QID) | ORAL | 0 refills | Status: DC

## 2021-11-21 NOTE — Patient Instructions (Addendum)
If you are age 57 or younger, your body mass index should be between 19-25. Your Body mass index is 21.84 kg/m?Marland Kitchen If this is out of the aformentioned range listed, please consider follow up with your Primary Care Provider.  ? ?__________________________________________________________ ? ?The [facility] providers would like to encourage you to use [facility] to communicate with providers for non-urgent requests or questions.  Due to long hold times on the telephone, sending your provider a message by [facility] may be a faster and more efficient way to get a response.  Please allow 48 business hours for a response.  Please remember that this is for non-urgent requests.   ? ?Due to recent changes in healthcare laws, you may see the results of your imaging and laboratory studies on MyChart before your provider has had a chance to review them.  We understand that in some cases there may be results that are confusing or concerning to you. Not all laboratory results come back in the same time frame and the provider may be waiting for multiple results in order to interpret others.  Please give [place] 48 hours in order for your provider to thoroughly review all the results before contacting the office for clarification of your results. ? ?We have sent the following medications to your pharmacy for you to pick up at your convenience:  Carafate, Amitiza, Nitroglycerin 0.125 % ointment ? ?You have been scheduled for an endoscopy. Please follow written instructions given to you at your visit today. ?If you use inhalers (even only as needed), please bring them with you on the day of your procedure.  ? ?Please follow up in 3 months. Give [place] a call at [phone] to schedule an appointment.    ? ? ? ?Thank you for choosing me and [facility]. ? ?Gerrit Heck, D.O.  ? ?We want to thank you for trusting [facility] with your care. All of our staff and providers value the relationships we have built with  our patients, and it is an honor to care for you.  ? ?We are writing to let you know that [facility] will close on Jan 15, 2022, and we invite you to continue to see Dr. Carmell Austria and Gerrit Heck at the [facility] [place] office location. We are consolidating our serices at these [facility] practices to better provide care. Our office staff will work with you to ensure a seamless transition.  ? ?Gerrit Heck, DO -Dr. Bryan Lemma will be movig to [facility] at [address], [place], [place] [address], effective Jan 15, 2022.  Contact [phone] to schedule an appointment with him.  ? ?Carmell Austria, MD- Dr. Lyndel Safe will be movig to [facility] at [address], [place], [place] [address], effective Jan 15, 2022.  Contact [phone] to schedule an appointment with him.  ? ?Requesting Medical Records ?If you need to request your medical records, please follow the instructions below. Your medical records are confidential, and a copy can be transferred to another provider or released to you or another person you designate only with your permission. ? ?There are several ways to request your medical records: ?Requests for medical records can be submitted through our practice.   ?You can also request your records electronically, in your MyChart account by selecting the ?Request Health Records? tab.  ?If you need additional information on how to request records, please go to [email or web address]/, choose Patient Information, then select Request Medical Records. ?To make  an appointment or if you have any questions about your health care needs, please contact our office at [phone] and one of our staff members will be glad to assist you. ?[facility] is committed to providing exceptional care for you and our community. Thank you for allowing [place] to serve your health care needs. ?Sincerely, ? ?Windy Canny, Director [facility] ?[facility] also offers convenient virtual care options. Sore throat? Sinus problems? Cold or flu symptoms? Get care from the comfort of home with [facility] Video Visits and e-Visits. Learn more about the non-emergency conditions treated and start your virtual visit at [email or web address] ? ? ? ? ?

## 2021-11-21 NOTE — Progress Notes (Signed)
? ? ?          ?Chief Complaint: Abdominal pain, rectal pain, rectal bleeding ? ? ?Referring Provider:     Clayborne Dana, NP ? ? ?HPI:   ? ? ?Christian Hays is a 56 y.o. male referred to the Gastroenterology Clinic for evaluation of abdominal pain, rectal discomfort, and rectal bleeding. ? ?Previously followed at [facility] for chronic constipation and GERD, last seen 03/09/2020 for evaluation of abdominal pain.  Was still taking omeprazole 40 mg bid for epigastric pain and GU.  Due to ongoing pain, changed to pantoprazole 40 mg  bid, added Pepcid 20 mg  bid x90 days, and Carafate 1 g  qid x14 days.  For his constipation, was unable to afford Linzess.  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  Occasional BRB when he wipes. ? ?- RUQ [place] (04/12/2018; indication epigastric pain): Normal ?- CT A/P (01/2019; indication abdominal pain): Normal liver, GB, pancreas, spleen, GI tract ?- EGD (09/11/2019; indication epigastric pain, weight loss): 1 cm ulcer in gastric body, normal duodenum with normal path.  Treated with high-dose PPI and recommended repeat in 11/2019; never completed ?- Colonoscopy (09/11/2019): 7 mm tubular adenoma in ascending colon, internal hemorrhoids.  Recommended repeat in 09/2024 ?- CT chest (03/17/2021): Normal esophagus, mediastinum.  Possible sludge or small stones in GB neck ? ?Was seen on 10/24/2021 to establish care with his new PCM, and referred to GI clinic for evaluation of abdominal pain and rectal discomfort/rectal bleeding. ? ?Rectal pain with wiping for 5-6 years. Also with BRB on tissue paper and rarely drips into toilet water. Tried OTC hemorrhoid cream, without improvement.  ? ?Also with MEG pain with black stools. Occurs intermittently over last 3 months, with last episode last week. Typically lasts 2-3 days, then resolves.  ? ?He reports a history of hemorrhoid banding in 2019 at [facility],  with resolution of sxs for a few years, until recurrence of BRBPR in last 6 months or so.  ? ?No longer taking Miralax for constipation as he didn't think this was efficacious any longer, but was only taking 1 cap/week.  Prune juice helps, but causes abdominal pain.  ? ?Reflux otherwise well controlled with pantoprazole 40 mg BID. ? ? ?Reviewed most recent labs from 05/2021 and 07/2021: Normal CBC, BMP, TSH, CMP ? ?Past Medical History:  ?Diagnosis Date  ? GERD (gastroesophageal reflux disease)   ? History of colon polyps   ? Hypertension   ? Thyroid disease   ? ? ? ?Past Surgical History:  ?Procedure Laterality Date  ? APPENDECTOMY    ? COLONOSCOPY  09/11/2019  ? [facility]  ? ESOPHAGOGASTRODUODENOSCOPY  09/11/2019  ? [facility]  ? ?Family History  ?Problem Relation Age of Onset  ? Colon cancer Neg Hx   ? Esophageal cancer Neg Hx   ? ?Social History  ? ?Tobacco Use  ? Smoking status: Never  ? Smokeless tobacco: Never  ?Vaping Use  ? Vaping Use: Never used  ?Substance Use Topics  ? Alcohol use: No  ? Drug use: No  ? ?Current Outpatient Medications  ?Medication Sig Dispense Refill  ? pantoprazole (PROTONIX) 40 MG tablet Take 1 tablet (40 mg total) by mouth daily. 90 tablet 1  ? saline (AYR) GEL Place 1 application into both nostrils every 6 (six) hours as needed. 14.1 g 0  ? ?No current  facility-administered medications for this visit.  ? ?No Known Allergies ? ? ?Review of Systems: ?All systems reviewed and negative except where noted in HPI.  ? ? ? ?Physical Exam:   ? ?Wt Readings from Last 3 Encounters:  ?11/21/21 161 lb (73 kg)  ?10/24/21 157 lb 3.2 oz (71.3 kg)  ?04/12/18 148 lb (67.1 kg)  ? ? ?BP 110/74   Pulse 61   Ht 6' (1.829 m)   Wt 161 lb (73 kg)   BMI 21.84 kg/m?  ?Constitutional:  Pleasant, in no acute distress. ?Psychiatric: Normal mood and affect. Behavior is normal. ?Cardiovascular: Normal rate, regular rhythm. No edema ?Pulmonary/chest: Effort normal and breath sounds normal. No wheezing,  rales or rhonchi. ?Abdominal: Soft, nondistended, nontender. Bowel sounds active throughout. There are no masses palpable. No hepatomegaly. ?Neurological: Alert and oriented to person place and time. ?Skin: Skin is warm and dry. No rashes noted. ?Rectal exam: Sensation intact and preserved anal wink.  Small external skin tag.  Small, tender anal fissure in posterior position.  Normal sphincter tone, but rectal exam limited by tenderness of fissure.  (Chaperone: Alberteen Sam, CMA).  ? ? ? ?ASSESSMENT AND PLAN;  ? ?1) MEG pain ?2) Dark stools ?3) Hx of Gastric ulcer ?- EGD to evaluate for ulcer recurrence with diagnostic and potentially therapeutic intent ?- Continue Protonix ?- Start Carafate 1 g bid ? ?4)Anal Fissure: ?Physical exam notable for external anal fissure in the posterior position. Will treat as below: ? ?- Start topical NTG 0.125%, apply a small, pea-sized amount to the affected area BID for 6-8 weeks.  ?- We discussed the ADR of headache, and if patient does experience, to call me and will change and make pharmacy request to compound topical CCB (topical nifedipine 0.2-0.3% applied 2-4 times daily; unfortunately, the CCB also carries an ADR of headache in 5-12%). Additionally, cautioned to avoid strenuous activity within 30 minutes of application  ?- Start or continue fiber supplement  ?- Sitz bath with warm water for 10-15 minutes 2-3 times daily; directed to [employer] available online or at [employer]; ensure to dry area afterwards ?- If no improvement with appropriate trial of therapy, will either change meds or can consider endosocpic evaluation  ?- To f/u in the clinic in 3 months or sooner prn ?  ?5) Chronic constipation ?- Start Amitiza 24 mcg bid ?- Ensure drinking at least 64 ounces of water/day ?- Fiber supplement as above ?- If Amitiza ineffective, will retrial MiraLAX 1 cap/day (was underdosing previously) ? ?6) History of colon polyps ?- Repeat colonoscopy in 2026 for  ongoing polyp surveillance ? ?7) GERD ?- Reflux symptoms otherwise well controlled on current therapy ?- Evaluate for erosive esophagitis, hiatal hernia, LES laxity time EGD as above ? ?The indications, risks, and benefits of EGD were explained to the patient in detail. Risks include but are not limited to bleeding, perforation, adverse reaction to medications, and cardiopulmonary compromise. Sequelae include but are not limited to the possibility of surgery, hospitalization, and mortality. The patient verbalized understanding and wished to proceed. All questions answered, referred to scheduler. Further recommendations pending results of the exam.  ? ? ?Shellia Cleverly, DO, FACG  11/21/2021, 8:21 AM ? ? ?Clayborne Dana, NP ? ?

## 2021-11-22 ENCOUNTER — Telehealth: Payer: Self-pay | Admitting: Gastroenterology

## 2021-11-22 ENCOUNTER — Other Ambulatory Visit: Payer: Self-pay | Admitting: General Surgery

## 2021-11-22 MED ORDER — AMBULATORY NON FORMULARY MEDICATION: 1 refills | Status: AC

## 2021-11-22 NOTE — Telephone Encounter (Signed)
Sent new rx to pharmacy [facility]'s information is below: ?Address: [address], [place], [place] [address]  ?[phone] ? ?Left a voicemail for the patient ?

## 2021-11-22 NOTE — Telephone Encounter (Signed)
Pt states the Nitroglycerin is not covered by his insurance and would like to know if it could be changed to an alternative medication. ?

## 2021-11-22 NOTE — Telephone Encounter (Signed)
?  Try topical nifedipine 0.2%. Apply a pea sized amount to the rectum  2 times daily x6 weeks. RF 1 ?

## 2021-11-27 ENCOUNTER — Other Ambulatory Visit: Payer: Self-pay

## 2021-11-27 ENCOUNTER — Encounter: Payer: Self-pay | Admitting: Gastroenterology

## 2021-11-27 ENCOUNTER — Ambulatory Visit (AMBULATORY_SURGERY_CENTER): Payer: 59 | Admitting: Gastroenterology

## 2021-11-27 VITALS — BP 105/77 | HR 58 | Temp 98.6°F | Resp 14 | Ht 72.0 in | Wt 161.0 lb

## 2021-11-27 DIAGNOSIS — R195 Other fecal abnormalities: Secondary | ICD-10-CM

## 2021-11-27 DIAGNOSIS — Z8711 Personal history of peptic ulcer disease: Secondary | ICD-10-CM

## 2021-11-27 DIAGNOSIS — K319 Disease of stomach and duodenum, unspecified: Secondary | ICD-10-CM

## 2021-11-27 DIAGNOSIS — K219 Gastro-esophageal reflux disease without esophagitis: Secondary | ICD-10-CM

## 2021-11-27 DIAGNOSIS — K229 Disease of esophagus, unspecified: Secondary | ICD-10-CM | POA: Diagnosis not present

## 2021-11-27 DIAGNOSIS — K2970 Gastritis, unspecified, without bleeding: Secondary | ICD-10-CM

## 2021-11-27 DIAGNOSIS — R1013 Epigastric pain: Secondary | ICD-10-CM

## 2021-11-27 DIAGNOSIS — K2950 Unspecified chronic gastritis without bleeding: Secondary | ICD-10-CM | POA: Diagnosis not present

## 2021-11-27 MED ORDER — SODIUM CHLORIDE 0.9 % IV SOLN: 500.0000 mL | Freq: Once | INTRAVENOUS | Status: DC

## 2021-11-27 NOTE — Op Note (Signed)
Endoscopy Center ?Patient Name: Christian Hays ?Procedure Date: 11/27/2021 10:49 AM ?MRN: [id number] ?Endoscopist: Doristine Locks , MD ?Age: 56 ?Referring MD:  ?Date of Birth: Mar 17, 1966 ?Gender: Male ?Account #: [id number] ?Procedure:                Upper GI endoscopy ?Indications:              Epigastric abdominal pain, Suspected esophageal  ?                          reflux, Dark stools, history of gastric ulcer ?Medicines:                Monitored Anesthesia Care ?Procedure:                Pre-Anesthesia Assessment: ?                          - Prior to the procedure, a History and Physical  ?                          was performed, and patient medications and  ?                          allergies were reviewed. The patient's tolerance of  ?                          previous anesthesia was also reviewed. The risks  ?                          and benefits of the procedure and the sedation  ?                          options and risks were discussed with the patient.  ?                          All questions were answered, and informed consent  ?                          was obtained. Prior Anticoagulants: The patient has  ?                          taken no previous anticoagulant or antiplatelet  ?                          agents. ASA Grade Assessment: II - A patient with  ?                          mild systemic disease. After reviewing the risks  ?                          and benefits, the patient was deemed in  ?                          satisfactory condition to undergo the procedure. ?  After obtaining informed consent, the endoscope was  ?                          passed under direct vision. Throughout the  ?                          procedure, the patient's blood pressure, pulse, and  ?                          oxygen saturations were monitored continuously. The  ?                          GIF [id number] #[id number] was introduced through the  ?                          mouth, and  advanced to the second part of duodenum.  ?                          The upper GI endoscopy was accomplished without  ?                          difficulty. The patient tolerated the procedure  ?                          well. ?Scope In: ?Scope Out: ?Findings:                 One tongue of salmon-colored mucosa was present  ?                          from 42 to 44 cm. No other visible abnormalities  ?                          were present on white light and narrow band imaging  ?                          (NBI). The maximum longitudinal extent of these  ?                          esophageal mucosal changes was 2 cm in length.  ?                          Biopsies were taken with a cold forceps for  ?                          histology. Estimated blood loss was minimal. ?                          The upper third of the esophagus and middle third  ?                          of the esophagus were normal. ?  The entire stomach was normal. Biopsies were taken  ?                          with a cold forceps for Helicobacter pylori  ?                          testing. Estimated blood loss was minimal. ?                          The examined duodenum was normal. ?Complications:            No immediate complications. ?Estimated Blood Loss:     Estimated blood loss was minimal. ?Impression:               - Salmon-colored mucosa in the distal esophagus.  ?                          Biopsied. ?                          - Normal upper third of esophagus and middle third  ?                          of esophagus. ?                          - Normal stomach. Biopsied. ?                          - Normal examined duodenum. ?Recommendation:           - Patient has a contact number available for  ?                          emergencies. The signs and symptoms of potential  ?                          delayed complications were discussed with the  ?                          patient. Return to normal activities  tomorrow.  ?                          Written discharge instructions were provided to the  ?                          patient. ?                          - Resume previous diet. ?                          - Continue present medications. ?                          - Await pathology results. ?Doristine Locks, MD ?11/27/2021 11:04:25 AM ?

## 2021-11-27 NOTE — Telephone Encounter (Signed)
Patient said that medication wasn't sent there. Told patient it will be tomorrow I can send the medication and he verbalized understanding ?

## 2021-11-27 NOTE — Patient Instructions (Addendum)
Await for pathology results  ? ?YOU HAD AN ENDOSCOPIC PROCEDURE TODAY AT THE  ENDOSCOPY CENTER:   Refer to the procedure report that was given to you for any specific questions about what was found during the examination.  If the procedure report does not answer your questions, please call your gastroenterologist to clarify.  If you requested that your care partner not be given the details of your procedure findings, then the procedure report has been included in a sealed envelope for you to review at your convenience later. ? ?YOU SHOULD EXPECT: Some feelings of bloating in the abdomen. Passage of more gas than usual.  Walking can help get rid of the air that was put into your GI tract during the procedure and reduce the bloating. If you had a lower endoscopy (such as a colonoscopy or flexible sigmoidoscopy) you may notice spotting of blood in your stool or on the toilet paper. If you underwent a bowel prep for your procedure, you may not have a normal bowel movement for a few days. ? ?Please Note:  You might notice some irritation and congestion in your nose or some drainage.  This is from the oxygen used during your procedure.  There is no need for concern and it should clear up in a day or so. ? ? ?Following upper endoscopy (EGD) ? Vomiting of blood or coffee ground material ? New chest pain or pain under the shoulder blades ? Painful or persistently difficult swallowing ? New shortness of breath ? Fever of 100?F or higher ? Black, tarry-looking stools ? ?For urgent or emergent issues, a gastroenterologist can be reached at any hour by calling [phone]. ?Do not use MyChart messaging for urgent concerns.  ? ? ?DIET:  We do recommend a small meal at first, but then you may proceed to your regular diet.  Drink plenty of fluids but you should avoid alcoholic beverages for 24 hours. ? ?ACTIVITY:  You should plan to take it easy for the rest of today and you should NOT DRIVE or use heavy machinery until  tomorrow (because of the sedation medicines used during the test).   ? ?FOLLOW UP: ?Our staff will call the number listed on your records 48-72 hours following your procedure to check on you and address any questions or concerns that you may have regarding the information given to you following your procedure. If we do not reach you, we will leave a message.  We will attempt to reach you two times.  During this call, we will ask if you have developed any symptoms of COVID 19. If you develop any symptoms (ie: fever, flu-like symptoms, shortness of breath, cough etc.) before then, please call [phone].  If you test positive for Covid 19 in the 2 weeks post procedure, please call and report this information to [place].   ? ?If any biopsies were taken you will be contacted by phone or by letter within the next 1-3 weeks.  Please call us at [phone] if you have not heard about the biopsies in 3 weeks.  ? ? ?SIGNATURES/CONFIDENTIALITY: ?You and/or your care partner have signed paperwork which will be entered into your electronic medical record.  These signatures attest to the fact that that the information above on your After Visit Summary has been reviewed and is understood.  Full responsibility of the confidentiality of this discharge information lies with you and/or your care-partner. ? ?

## 2021-11-27 NOTE — Progress Notes (Signed)
? ?GASTROENTEROLOGY PROCEDURE H&P NOTE  ? ?Primary Care Physician: ?Clayborne Dana, NP ? ? ? ?Reason for Procedure:   Epigastric pain, dark stools, history of gastric ulcer, GERD ? ?Plan:    EGD ? ?Patient is appropriate for endoscopic procedure(s) in the ambulatory ([facility]) setting. ? ?The nature of the procedure, as well as the risks, benefits, and alternatives were carefully and thoroughly reviewed with the patient. Ample time for discussion and questions allowed. The patient understood, was satisfied, and agreed to proceed.  ? ? ? ?HPI: ?Christian Hays is a 56 y.o. male who presents for EGD for evaluation of epigastric pain, dark stools, and GERD, along with evaluation due to prior hx of gastric ulcer.  Patient was most recently seen in the Gastroenterology Clinic on 11/21/2021 by me.  No interval change in medical history since that appointment. Please refer to that note for full details regarding GI history and clinical presentation.  ? ?Past Medical History:  ?Diagnosis Date  ? GERD (gastroesophageal reflux disease)   ? History of colon polyps   ? Hypertension   ? Thyroid disease   ? ? ?Past Surgical History:  ?Procedure Laterality Date  ? APPENDECTOMY    ? COLONOSCOPY  09/11/2019  ? [facility]  ? ESOPHAGOGASTRODUODENOSCOPY  09/11/2019  ? [facility]  ? UPPER GASTROINTESTINAL ENDOSCOPY    ? ? ?Prior to Admission medications   ?Medication Sig Start Date End Date Taking? Authorizing Provider  ?lubiprostone (AMITIZA) 24 MCG capsule Take 1 capsule (24 mcg total) by mouth 2 (two) times daily with a meal. 11/21/21  Yes Bradrick Kamau V, DO  ?pantoprazole (PROTONIX) 40 MG tablet Take 1 tablet (40 mg total) by mouth daily. 10/24/21  Yes Clayborne Dana, NP  ?AMBULATORY NON FORMULARY MEDICATION Nifedipine 0.2% ointment Dispense  (1) 30 gram tube-Use a pea sized amount per rectum  2 times daily ?Patient not taking: Reported on 11/27/2021 11/22/21   Issa Kosmicki, Gae Bon V, DO  ?saline (AYR) GEL Place 1 application into both  nostrils every 6 (six) hours as needed. 10/24/21   Clayborne Dana, NP  ?sucralfate (CARAFATE) 1 g tablet Take 1 tablet (1 g total) by mouth 4 (four) times daily. Take 1 tablet (1 g total) by mouth 4 (four) times daily for 2 weeks. 11/21/21   Alpha Mysliwiec, Verlin Dike, DO  ? ? ?Current Outpatient Medications  ?Medication Sig Dispense Refill  ? lubiprostone (AMITIZA) 24 MCG capsule Take 1 capsule (24 mcg total) by mouth 2 (two) times daily with a meal. 60 capsule 2  ? pantoprazole (PROTONIX) 40 MG tablet Take 1 tablet (40 mg total) by mouth daily. 90 tablet 1  ? AMBULATORY NON FORMULARY MEDICATION Nifedipine 0.2% ointment Dispense  (1) 30 gram tube-Use a pea sized amount per rectum  2 times daily (Patient not taking: Reported on 11/27/2021) 1 Tube 1  ? saline (AYR) GEL Place 1 application into both nostrils every 6 (six) hours as needed. 14.1 g 0  ? sucralfate (CARAFATE) 1 g tablet Take 1 tablet (1 g total) by mouth 4 (four) times daily. Take 1 tablet (1 g total) by mouth 4 (four) times daily for 2 weeks. 56 tablet 0  ? ?Current Facility-Administered Medications  ?Medication Dose Route Frequency Provider Last Rate Last Admin  ? 0.9 %  sodium chloride infusion  500 mL Intravenous Once Rooney Swails V, DO      ? ? ?Allergies as of 11/27/2021  ? (No Known Allergies)  ? ? ?Family  History  ?Problem Relation Age of Onset  ? Colon cancer Neg Hx   ? Esophageal cancer Neg Hx   ? ? ?Social History  ? ?Socioeconomic History  ? Marital status: Married  ?  Spouse name: Not on file  ? Number of children: Not on file  ? Years of education: Not on file  ? Highest education level: Not on file  ?Occupational History  ? Not on file  ?Tobacco Use  ? Smoking status: Never  ? Smokeless tobacco: Never  ?Vaping Use  ? Vaping Use: Never used  ?Substance and Sexual Activity  ? Alcohol use: No  ? Drug use: No  ? Sexual activity: Not on file  ?Other Topics Concern  ? Not on file  ?Social History Narrative  ? Not on file  ? ?Social Determinants of  Health  ? ?Financial Resource Strain: Not on file  ?Food Insecurity: Not on file  ?Transportation Needs: Not on file  ?Physical Activity: Not on file  ?Stress: Not on file  ?Social Connections: Not on file  ?Intimate Partner Violence: Not on file  ? ? ?Physical Exam: ?Vital signs in last 24 hours: ?@BP  108/71   Pulse (!) 59   Temp 98.6 ?F (37 ?C) (Temporal)   Ht 6' (1.829 m)   Wt 161 lb (73 kg)   SpO2 99%   BMI 21.84 kg/m?  ?GEN: NAD ?EYE: Sclerae anicteric ?ENT: MMM ?CV: Non-tachycardic ?Pulm: CTA b/l ?GI: Soft, NT/ND ?NEURO:  Alert & Oriented x 3 ? ? ? , DO ?[facility] ? ? ?11/27/2021 10:44 AM ? ?

## 2021-11-27 NOTE — Progress Notes (Signed)
VS completed by DT.  Pt's states no medical or surgical changes since previsit or office visit.  

## 2021-11-27 NOTE — Progress Notes (Signed)
To Pacu, VSS. Report to RN.tb 

## 2021-11-27 NOTE — Progress Notes (Signed)
Called to room to assist during endoscopic procedure.  Patient ID and intended procedure confirmed with present staff. Received instructions for my participation in the procedure from the performing physician.  

## 2021-11-27 NOTE — Telephone Encounter (Signed)
Was told patient had the scipt in hand was directed to got to [facility] since he was in [place] with his wife already.  ?

## 2021-11-28 NOTE — Telephone Encounter (Signed)
LVM for patient to call back to see if he got his medication ?

## 2021-11-29 ENCOUNTER — Telehealth: Payer: Self-pay | Admitting: Gastroenterology

## 2021-11-29 ENCOUNTER — Telehealth: Payer: Self-pay | Admitting: *Deleted

## 2021-11-29 ENCOUNTER — Telehealth: Payer: Self-pay

## 2021-11-29 NOTE — Telephone Encounter (Signed)
Called Morrie Sheldon from [employer],they would like to use generic lubiprostone, expressed that it was originally sent for this. Morrie Sheldon verbalized understanding and will contact the office if any thing else is needed. ?

## 2021-11-29 NOTE — Telephone Encounter (Signed)
Left message on f/u call 

## 2021-11-29 NOTE — Telephone Encounter (Signed)
Received a call from Clarence from [employer] regarding patient's Amitiza.  She wants to know if brand name is necessary or if a generic can be substituted.  Please call Morrie Sheldon at [phone], [phone].  Thank you. ?

## 2021-11-29 NOTE — Telephone Encounter (Signed)
?  Follow up Call- ? ? ?  11/27/2021  ? 10:13 AM  ?Call back number  ?Post procedure Call Back phone  # [phone]  ?Permission to leave phone message Yes  ?  ? ?Patient questions: ? ?Do you have a fever, pain , or abdominal swelling? No. ?Pain Score  0 * ? ?Have you tolerated food without any problems? Yes.   ? ?Have you been able to return to your normal activities? Yes.   ? ?Do you have any questions about your discharge instructions: ?Diet   No. ?Medications  No. ?Follow up visit  No. ? ?Do you have questions or concerns about your Care? No. ? ?Actions: ?* If pain score is 4 or above: ?No action needed, pain <4. ? ? ?

## 2021-12-01 ENCOUNTER — Telehealth: Payer: Self-pay

## 2021-12-01 ENCOUNTER — Encounter: Payer: Self-pay | Admitting: Gastroenterology

## 2021-12-01 NOTE — Telephone Encounter (Signed)
Received  and faxed PA form from [employer] [facility] for Amitiza. ?

## 2021-12-01 NOTE — Telephone Encounter (Signed)
Prior authorization form was faxed to [facility] office.  ?

## 2021-12-01 NOTE — Telephone Encounter (Signed)
Received call from [employer] [facility] [phone]) who stated they need a form filled out for the [place]. They are faxing form to [phone].  ?

## 2022-01-24 ENCOUNTER — Encounter: Payer: Self-pay | Admitting: Family Medicine

## 2022-01-24 ENCOUNTER — Ambulatory Visit (INDEPENDENT_AMBULATORY_CARE_PROVIDER_SITE_OTHER): Payer: 59 | Admitting: Family Medicine

## 2022-01-24 VITALS — BP 110/61 | HR 56 | Temp 97.6°F | Ht 72.0 in | Wt 154.4 lb

## 2022-01-24 DIAGNOSIS — R195 Other fecal abnormalities: Secondary | ICD-10-CM

## 2022-01-24 DIAGNOSIS — K219 Gastro-esophageal reflux disease without esophagitis: Secondary | ICD-10-CM

## 2022-01-24 DIAGNOSIS — K921 Melena: Secondary | ICD-10-CM

## 2022-01-24 DIAGNOSIS — K602 Anal fissure, unspecified: Secondary | ICD-10-CM

## 2022-01-24 DIAGNOSIS — R1013 Epigastric pain: Secondary | ICD-10-CM

## 2022-01-24 DIAGNOSIS — R197 Diarrhea, unspecified: Secondary | ICD-10-CM | POA: Diagnosis not present

## 2022-01-24 MED ORDER — SUCRALFATE 1 G PO TABS
1.0000 g | ORAL_TABLET | Freq: Four times a day (QID) | ORAL | 0 refills | Status: AC
Start: 2022-01-24 — End: ?

## 2022-01-24 MED ORDER — DICYCLOMINE HCL 10 MG PO CAPS: ORAL_CAPSULE | ORAL | 0 refills | Status: DC

## 2022-01-24 NOTE — Addendum Note (Signed)
Addended by: Rosita Kea on: 01/24/2022 07:49 AM   Modules accepted: Orders

## 2022-01-24 NOTE — Progress Notes (Signed)
Chief Complaint  Patient presents with   Abdominal Pain    Diarrhea     Christian Hays is 56 y.o. male here for complaint of diarrhea.  Duration: 3 weeks Abdominal pain? Yes; cramping Bleeding? No; has had tarry stools but hx of ulcers Recent travel? No Recent antibiotic use? No Sick contacts? Boss had same thing Fevers? No Therapies tried: Protonix, Carafate for ulcers, Imodium  Past Medical History:  Diagnosis Date   GERD (gastroesophageal reflux disease)    History of colon polyps    Hypertension    Thyroid disease     BP 110/61   Pulse (!) 56   Temp 97.6 F (36.4 C) (Oral)   Ht 6' (1.829 m)   Wt 154 lb 6 oz (70 kg)   SpO2 98%   BMI 20.94 kg/m  Gen: awake, alert, appearing stated age HEENT: MMD Heart: Reg rhythm, bradycardic, no LE edema Lungs: CTAB, no accessory muscle use Abd: BS+, soft, diffusely TTP, non-distended, no masses or organomegaly Psych: Age appropriate judgment and insight  Diarrhea of presumed infectious origin - Plan: GI Profile, Stool, PCR  Gastroesophageal reflux disease without esophagitis - Plan: sucralfate (CARAFATE) 1 g tablet  Abdominal pain, epigastric - Plan: sucralfate (CARAFATE) 1 g tablet  Dark stools - Plan: sucralfate (CARAFATE) 1 g tablet  Hematochezia - Plan: sucralfate (CARAFATE) 1 g tablet  Anal fissure - Plan: sucralfate (CARAFATE) 1 g tablet  Encouraged adequate hydration with water and electrolyte replacement. Bentyl prn. Ck stool cx.  If worsening, seek immediate care. Refilled carafate for him.  F/u prn. The patient voiced understanding and agreement to the plan.  P[place]iar, DO 01/24/22 7:40 AM

## 2022-01-24 NOTE — Patient Instructions (Addendum)
As long as you are vomiting or having diarrhea, please drink fluids with electrolytes like Gatorade, Powerade, or Pedialyte (if you can stomach the taste). Drink these along with water mainly.   Ok to continue Imodium as needed. Try to avoid if possible.   Let us know if you need anything.

## 2022-01-26 LAB — GI PROFILE, STOOL, PCR

## 2022-06-26 ENCOUNTER — Other Ambulatory Visit (INDEPENDENT_AMBULATORY_CARE_PROVIDER_SITE_OTHER): Payer: Commercial Managed Care - HMO

## 2022-06-26 ENCOUNTER — Ambulatory Visit (INDEPENDENT_AMBULATORY_CARE_PROVIDER_SITE_OTHER): Payer: Commercial Managed Care - HMO | Admitting: Family

## 2022-06-26 ENCOUNTER — Encounter: Payer: Self-pay | Admitting: Family

## 2022-06-26 VITALS — BP 110/80 | HR 63 | Temp 97.6°F | Ht 72.0 in | Wt 155.2 lb

## 2022-06-26 DIAGNOSIS — R109 Unspecified abdominal pain: Secondary | ICD-10-CM | POA: Diagnosis not present

## 2022-06-26 DIAGNOSIS — Z23 Encounter for immunization: Secondary | ICD-10-CM

## 2022-06-26 DIAGNOSIS — R350 Frequency of micturition: Secondary | ICD-10-CM | POA: Diagnosis not present

## 2022-06-26 LAB — COMPREHENSIVE METABOLIC PANEL
ALT: 31 U/L (ref 0–53)
AST: 31 U/L (ref 0–37)
Albumin: 4.6 g/dL (ref 3.5–5.2)
Alkaline Phosphatase: 77 U/L (ref 39–117)
BUN: 17 mg/dL (ref 6–23)
CO2: 31 mEq/L (ref 19–32)
Calcium: 9.2 mg/dL (ref 8.4–10.5)
Chloride: 103 mEq/L (ref 96–112)
Creatinine, Ser: 1.11 mg/dL (ref 0.40–1.50)
GFR: 73.86 mL/min (ref >60.00)
Glucose, Bld: 82 mg/dL (ref 70–99)
Potassium: 4.7 mEq/L (ref 3.5–5.1)
Sodium: 138 mEq/L (ref 135–145)
Total Bilirubin: 1.1 mg/dL (ref 0.2–1.2)
Total Protein: 7.2 g/dL (ref 6.0–8.3)

## 2022-06-26 LAB — CBC WITH DIFFERENTIAL/PLATELET
Basophils Absolute: 0 10*3/uL (ref 0.0–0.1)
Basophils Relative: 1 % (ref 0.0–3.0)
Eosinophils Absolute: 0.3 10*3/uL (ref 0.0–0.7)
Eosinophils Relative: 6.7 % — ABNORMAL HIGH (ref 0.0–5.0)
HCT: 47.1 % (ref 39.0–52.0)
Hemoglobin: 15.9 g/dL (ref 13.0–17.0)
Lymphocytes Relative: 32.1 % (ref 12.0–46.0)
Lymphs Abs: 1.6 10*3/uL (ref 0.7–4.0)
MCHC: 33.6 g/dL (ref 30.0–36.0)
MCV: 86.5 fl (ref 78.0–100.0)
Monocytes Absolute: 0.5 10*3/uL (ref 0.1–1.0)
Monocytes Relative: 9 % (ref 3.0–12.0)
Neutro Abs: 2.6 10*3/uL (ref 1.4–7.7)
Neutrophils Relative %: 51.2 % (ref 43.0–77.0)
Platelets: 237 10*3/uL (ref 150.0–400.0)
RBC: 5.45 Mil/uL (ref 4.22–5.81)
RDW: 13.3 % (ref 11.5–15.5)
WBC: 5.1 10*3/uL (ref 4.0–10.5)

## 2022-06-26 LAB — PSA: PSA: 0.42 ng/mL (ref 0.10–4.00)

## 2022-06-26 LAB — HEMOGLOBIN A1C: Hgb A1c MFr Bld: 5.2 % (ref 4.6–6.5)

## 2022-06-26 MED ORDER — TAMSULOSIN HCL 0.4 MG PO CAPS: 0.4000 mg | ORAL_CAPSULE | Freq: Every day | ORAL | 3 refills | Status: AC

## 2022-06-26 MED ORDER — DICYCLOMINE HCL 10 MG PO CAPS: ORAL_CAPSULE | ORAL | 0 refills | Status: AC

## 2022-06-26 NOTE — Progress Notes (Signed)
Christian Hays is a 56 y.o. male with the following history as recorded in [employer]:  Patient Active Problem List   Diagnosis Date Noted   Urticaria 06/10/2017   Elevated WBC count 06/10/2017   GERD (gastroesophageal reflux disease) 06/10/2017    Current Outpatient Medications  Medication Sig Dispense Refill   pantoprazole (PROTONIX) 40 MG tablet Take 1 tablet (40 mg total) by mouth daily. 90 tablet 1   saline (AYR) GEL Place 1 application into both nostrils every 6 (six) hours as needed. 14.1 g 0   tamsulosin (FLOMAX) 0.4 MG CAPS capsule Take 1 capsule (0.4 mg total) by mouth daily. 30 capsule 3   AMBULATORY NON FORMULARY MEDICATION Nifedipine 0.2% ointment Dispense  (1) 30 gram tube-Use a pea sized amount per rectum  2 times daily (Patient not taking: Reported on 06/26/2022) 1 Tube 1   dicyclomine (BENTYL) 10 MG capsule Take 1 tab every 6 hours as needed for abdominal cramping. 60 capsule 0   sucralfate (CARAFATE) 1 g tablet Take 1 tablet (1 g total) by mouth 4 (four) times daily. (Patient not taking: Reported on 06/26/2022) 56 tablet 0   No current facility-administered medications for this visit.    Allergies: Patient has no known allergies.  Past Medical History:  Diagnosis Date   GERD (gastroesophageal reflux disease)    History of colon polyps    Hypertension    Thyroid disease     Past Surgical History:  Procedure Laterality Date   APPENDECTOMY     COLONOSCOPY  09/11/2019   [facility], Dr. Margaretmary Bayley. The terminal ileum examined was normal. Internal hemorrhoids. Polyp 54m in the ascending colon. Repeat in 3-5 years   ESOPHAGOGASTRODUODENOSCOPY  09/11/2019   [facility], Dr. RMargaretmary Bayley The [place] and Z-line were at 40 cm. Normal mucosa in the whole esophagus. Ulcer in the stomach body. (Biopsy). Normal mucosa in the duodenum. (Biopsy).   UPPER GASTROINTESTINAL ENDOSCOPY      Family History  Problem Relation Age of Onset   Colon cancer Neg Hx     Esophageal cancer Neg Hx     Social History   Tobacco Use   Smoking status: Never   Smokeless tobacco: Never  Substance Use Topics   Alcohol use: No    Subjective:   2 month concerns for urinary frequency; nocturia- 4-5 x per night; "hard time to get urine started" no burning; no abdominal pain;  In reviewing records, it [facility]- was evaluated for infection and urology follow up was recommended; unfortunately, he was lost to follow up;    Objective:  Vitals:   06/26/22 0921  BP: 110/80  Pulse: 63  Temp: 97.6 F (36.4 C)  TempSrc: Oral  SpO2: 98%  Weight: 155 lb 3.2 oz (70.4 kg)  Height: 6' (1.829 m)    General: Well developed, well nourished, in no acute distress  Skin : Warm and dry.  Head: Normocephalic and atraumatic  Lungs: Respirations unlabored; clear to auscultation bilaterally without wheeze, rales, rhonchi  CVS exam: normal rate and regular rhythm.  Neurologic: Alert and oriented; speech intact; face symmetrical; moves all extremities well; CNII-XII intact without focal deficit   Assessment:  1. Urinary frequency   2. Abdominal cramping     Plan:  Suspect BPH; will update labs today to include CBC, CMP, PSA, hgba1c; trial of Flomax- use as directed; refer to urology;  2.   Intermittent symptoms- requesting  refill on Bentyl to use prn; has had GI evaluation earlier this year;   No follow-ups on file.  Orders Placed This Encounter  Procedures   CBC with Differential/Platelet   Comp Met (CMET)   Hemoglobin A1c   PSA   Ambulatory referral to Urology    Referral Priority:   Routine    Referral Type:   Consultation    Referral Reason:   Specialty Services Required    Requested Specialty:   Urology    Number of Visits Requested:   1    Requested Prescriptions   Signed Prescriptions Disp Refills   tamsulosin (FLOMAX) 0.4 MG CAPS capsule 30 capsule 3    Sig: Take 1 capsule (0.4 mg total) by mouth  daily.   dicyclomine (BENTYL) 10 MG capsule 60 capsule 0    Sig: Take 1 tab every 6 hours as needed for abdominal cramping.

## 2022-06-26 NOTE — Patient Instructions (Signed)

## 2022-07-05 ENCOUNTER — Encounter: Payer: Self-pay | Admitting: Family

## 2024-08-09 ENCOUNTER — Emergency Department (HOSPITAL_BASED_OUTPATIENT_CLINIC_OR_DEPARTMENT_OTHER)
Admission: EM | Admit: 2024-08-09 | Discharge: 2024-08-09 | Disposition: A | Discharge status: Home / Self Care | Attending: Emergency Medicine | Admitting: Emergency Medicine

## 2024-08-09 ENCOUNTER — Other Ambulatory Visit: Payer: Self-pay

## 2024-08-09 ENCOUNTER — Emergency Department (HOSPITAL_BASED_OUTPATIENT_CLINIC_OR_DEPARTMENT_OTHER)

## 2024-08-09 ENCOUNTER — Encounter (HOSPITAL_BASED_OUTPATIENT_CLINIC_OR_DEPARTMENT_OTHER): Payer: Self-pay

## 2024-08-09 DIAGNOSIS — R072 Precordial pain: Secondary | ICD-10-CM | POA: Insufficient documentation

## 2024-08-09 DIAGNOSIS — I10 Essential (primary) hypertension: Secondary | ICD-10-CM | POA: Insufficient documentation

## 2024-08-09 LAB — BASIC METABOLIC PANEL WITH GFR
Anion gap: 11 (ref 5–15)
BUN: 19 mg/dL (ref 6–20)
CO2: 27 mmol/L (ref 22–32)
Calcium: 9.2 mg/dL (ref 8.9–10.3)
Chloride: 101 mmol/L (ref 98–111)
Creatinine, Ser: 1.23 mg/dL (ref 0.61–1.24)
GFR, Estimated: >60 mL/min (ref >60)
Glucose, Bld: 90 mg/dL (ref 70–99)
Potassium: 4.2 mmol/L (ref 3.5–5.1)
Sodium: 138 mmol/L (ref 135–145)

## 2024-08-09 LAB — CBC
HCT: 46.7 % (ref 39.0–52.0)
Hemoglobin: 16.3 g/dL (ref 13.0–17.0)
MCH: 29.7 pg (ref 26.0–34.0)
MCHC: 34.9 g/dL (ref 30.0–36.0)
MCV: 85.1 fL (ref 80.0–100.0)
Platelets: 250 K/uL (ref 150–400)
RBC: 5.49 MIL/uL (ref 4.22–5.81)
RDW: 12.5 % (ref 11.5–15.5)
WBC: 8.2 K/uL (ref 4.0–10.5)
nRBC: 0 % (ref 0.0–0.2)

## 2024-08-09 LAB — TROPONIN T, HIGH SENSITIVITY
Troponin T High Sensitivity: <15 ng/L (ref 0–19)
Troponin T High Sensitivity: <15 ng/L (ref 0–19)

## 2024-08-09 MED ORDER — KETOROLAC TROMETHAMINE 15 MG/ML IJ SOLN
15.0000 mg | Freq: Once | INTRAMUSCULAR | Status: AC
  Administered 2024-08-09: 15 mg via INTRAVENOUS
  Filled 2024-08-09: qty 1

## 2024-08-09 NOTE — ED Notes (Signed)
 Pt medicated as ordered for pain to left side of chest 4/10. Denies other needs at this time, call bell in reach, vitals updated and stable.

## 2024-08-09 NOTE — ED Provider Notes (Signed)
 [facility] Provider Note   CSN: [id number] Arrival date & time: 08/09/24  9940     Patient presents with: Chest Pain   Christian Hays is a 58 y.o. male.   The history is provided by the patient.   Patient with history of GERD, hyperlipidemia presents with chest pain.  Patient reports a past several days she has had brief episodes of left-sided chest pressure. Tonight he felt his heart racing and has pain in his left chest that is worse with breathing.  It is also worse with palpation.  He reports recent cough without hemoptysis.  No shortness of breath.  No fevers.  No recent travel.  He is a non-smoker.  No known history of CAD/VTE. Denies any sharp or tearing sensation   Past Medical History:  Diagnosis Date   GERD (gastroesophageal reflux disease)    History of colon polyps    Hypertension    Thyroid  disease     Prior to Admission medications   Medication Sig Start Date End Date Taking? Authorizing Provider  AMBULATORY NON FORMULARY MEDICATION Nifedipine 0.2% ointment Dispense  (1) 30 gram tube-Use a pea sized amount per rectum  2 times daily Patient not taking: Reported on 06/26/2022 11/22/21   Cirigliano, Vito V, DO  dicyclomine  (BENTYL ) 10 MG capsule Take 1 tab every 6 hours as needed for abdominal cramping. 06/26/22   Jason Leita Repine, FNP  pantoprazole  (PROTONIX ) 40 MG tablet Take 1 tablet (40 mg total) by mouth daily. 10/24/21   Beck, Taylor B, NP  saline (AYR) GEL Place 1 application into both nostrils every 6 (six) hours as needed. 10/24/21   Almarie Waddell NOVAK, NP  sucralfate  (CARAFATE ) 1 g tablet Take 1 tablet (1 g total) by mouth 4 (four) times daily. Patient not taking: Reported on 06/26/2022 01/24/22   Frann Mabel Mt, DO  tamsulosin  (FLOMAX ) 0.4 MG CAPS capsule Take 1 capsule (0.4 mg total) by mouth daily. 06/26/22   Jason Leita Repine, FNP    Allergies: Patient has no known allergies.    Review of  Systems  Constitutional:  Negative for fever.  Respiratory:  Positive for cough.   Cardiovascular:  Positive for chest pain. Negative for leg swelling.    Updated Vital Signs BP 120/87 (BP Location: Left Arm)   Pulse 62   Temp 97.6 F (36.4 C) (Oral)   Resp 12   Ht 1.829 m (6')   Wt 70.3 kg   SpO2 98%   BMI 21.02 kg/m   Physical Exam CONSTITUTIONAL: Well developed/well nourished HEAD: Normocephalic/atraumatic EYES: EOMI/PERRL ENMT: Mucous membranes moist, no stridor, no drooling NECK: supple no meningeal signs CV: S1/S2 noted, no murmurs/rubs/gallops noted LUNGS: Lungs are clear to auscultation bilaterally, no apparent distress Chest-pinpoint tenderness noted to the left anterior chest wall.  No bruising or crepitance ABDOMEN: soft, nontender, no rebound or guarding, bowel sounds noted throughout abdomen GU:no cva tenderness NEURO: Pt is awake/alert/appropriate, moves all extremitiesx4.  No facial droop.   EXTREMITIES: pulses normal/equalx4, full ROM, no calf tenderness SKIN: warm, color normal PSYCH: no abnormalities of mood noted, alert and oriented to situation  (all labs ordered are listed, but only abnormal results are displayed) Labs Reviewed  BASIC METABOLIC PANEL WITH GFR  CBC  TROPONIN T, HIGH SENSITIVITY  TROPONIN T, HIGH SENSITIVITY    EKG: EKG Interpretation Date/Time:  Sunday August 09 2024 01:06:59 EST Ventricular Rate:  68 PR Interval:  159 QRS Duration:  101 QT Interval:  439 QTC Calculation: 467 R Axis:   69  Text Interpretation: Sinus rhythm Low voltage, precordial leads Confirmed by Midge Golas ([id number]) on 08/09/2024 1:29:50 AM  Radiology: DG Chest 2 View Result Date: 08/09/2024 EXAM: 2 VIEW(S) XRAY OF THE CHEST 08/09/2024 01:25:00 AM COMPARISON: 12/30/2023 CLINICAL HISTORY: chest pressure FINDINGS: LUNGS AND PLEURA: No focal pulmonary opacity. No pleural effusion. No pneumothorax. HEART AND MEDIASTINUM: No acute abnormality of the  cardiac and mediastinal silhouettes. BONES AND SOFT TISSUES: No acute osseous abnormality. IMPRESSION: 1. No acute cardiopulmonary process. Electronically signed by: Franky Crease MD 08/09/2024 01:29 AM EST RP Workstation: [id number]     Procedures   Medications Ordered in the ED  ketorolac  (TORADOL ) 15 MG/ML injection 15 mg (15 mg Intravenous Given 08/09/24 0217)                HEART Score: 2                    Medical Decision Making Amount and/or Complexity of Data Reviewed Labs: ordered. Radiology: ordered.  Risk Prescription drug management.   This patient presents to the ED for concern of chest pain, this involves an extensive number of treatment options, and is a complaint that carries with it a high risk of complications and morbidity.  The differential diagnosis includes but is not limited to acute coronary syndrome, aortic dissection, pulmonary embolism, pericarditis, pneumothorax, pneumonia, myocarditis, pleurisy, esophageal rupture   Comorbidities that complicate the patient evaluation: Patient's presentation is complicated by their history of hyperlipidemia  Additional history obtained: Records reviewed Care Everywhere/External Records  Lab Tests: I Ordered, and personally interpreted labs.  The pertinent results include: Labs reassuring  Imaging Studies ordered: I ordered imaging studies including X-ray chest  I independently visualized and interpreted imaging which showed no acute findings I agree with the radiologist interpretation  Cardiac Monitoring: The patient was maintained on a cardiac monitor.  I personally viewed and interpreted the cardiac monitor which showed an underlying rhythm of:  sinus rhythm  Medicines ordered and prescription drug management: I ordered medication including Toradol  for pain Reevaluation of the patient after these medicines showed that the patient    improved  Test Considered: Patient is low risk / negative by heart score,  therefore do not feel that cardiac admission is indicated.  Reevaluation: After the interventions noted above, I reevaluated the patient and found that they have :improved  Complexity of problems addressed: Patient's presentation is most consistent with  acute presentation with potential threat to life or bodily function  Disposition: After consideration of the diagnostic results and the patient's response to treatment,  I feel that the patent would benefit from discharge  .   Workup overall unremarkable.  Pain is resolved with Toradol .  It was reproducible on exam.  Low suspicion for ACS/PE/dissection.  He is safe for discharge     Final diagnoses:  Precordial pain    ED Discharge Orders     None          Midge Golas, MD 08/09/24 [phone]

## 2024-08-09 NOTE — ED Triage Notes (Signed)
 Complaining of his heart racing and having some chest pressure in the center for the last couple of days. [place] tonight his left shoulder started having a weird sensation so he came to the ED

## 2024-08-09 NOTE — ED Notes (Signed)
 2nd trop drawn and sent to lab. Pt reports he is  0/10 in pain.

## 2024-08-09 NOTE — Discharge Instructions (Signed)

## 2024-08-09 NOTE — ED Notes (Signed)
 Patient denies pain and is resting comfortably.
